# Patient Record
Sex: Female | Born: 1960 | Race: Black or African American | Hispanic: No | Marital: Married | Smoking: Former smoker
Health system: Southern US, Community
[De-identification: ages and names within clinical notes are randomized; demographics above are authoritative.]

## PROBLEM LIST (undated history)

## (undated) DIAGNOSIS — I10 Essential (primary) hypertension: Secondary | ICD-10-CM

## (undated) DIAGNOSIS — E785 Hyperlipidemia, unspecified: Secondary | ICD-10-CM

## (undated) DIAGNOSIS — M1990 Unspecified osteoarthritis, unspecified site: Secondary | ICD-10-CM

## (undated) DIAGNOSIS — R7302 Impaired glucose tolerance (oral): Secondary | ICD-10-CM

## (undated) DIAGNOSIS — Z860100 Personal history of colon polyps, unspecified: Secondary | ICD-10-CM

## (undated) DIAGNOSIS — E119 Type 2 diabetes mellitus without complications: Secondary | ICD-10-CM

## (undated) DIAGNOSIS — N921 Excessive and frequent menstruation with irregular cycle: Secondary | ICD-10-CM

## (undated) DIAGNOSIS — N6452 Nipple discharge: Secondary | ICD-10-CM

## (undated) DIAGNOSIS — L309 Dermatitis, unspecified: Secondary | ICD-10-CM

## (undated) DIAGNOSIS — J302 Other seasonal allergic rhinitis: Secondary | ICD-10-CM

## (undated) HISTORY — DX: Personal history of colon polyps, unspecified: Z86.0100

## (undated) HISTORY — DX: Essential (primary) hypertension: I10

## (undated) HISTORY — PX: HEMORRHOID SURGERY: SHX153

## (undated) HISTORY — DX: Impaired glucose tolerance (oral): R73.02

## (undated) HISTORY — DX: Personal history of colonic polyps: Z86.010

## (undated) HISTORY — DX: Hyperlipidemia, unspecified: E78.5

## (undated) HISTORY — DX: Excessive and frequent menstruation with irregular cycle: N92.1

## (undated) HISTORY — PX: COLONOSCOPY W/ POLYPECTOMY: SHX1380

## (undated) HISTORY — DX: Nipple discharge: N64.52

---

## 1998-08-30 ENCOUNTER — Emergency Department (HOSPITAL_COMMUNITY): Admission: EM | Admit: 1998-08-30 | Discharge: 1998-08-30 | Payer: Self-pay | Admitting: Emergency Medicine

## 2002-01-11 ENCOUNTER — Other Ambulatory Visit: Admission: RE | Admit: 2002-01-11 | Discharge: 2002-01-11 | Payer: Self-pay | Admitting: Family Medicine

## 2004-03-26 ENCOUNTER — Ambulatory Visit (HOSPITAL_COMMUNITY): Admission: RE | Admit: 2004-03-26 | Discharge: 2004-03-26 | Payer: Self-pay | Admitting: Family Medicine

## 2004-03-26 ENCOUNTER — Other Ambulatory Visit: Admission: RE | Admit: 2004-03-26 | Discharge: 2004-03-26 | Payer: Self-pay | Admitting: Family Medicine

## 2005-03-18 ENCOUNTER — Ambulatory Visit: Payer: Self-pay | Admitting: Internal Medicine

## 2005-03-19 ENCOUNTER — Ambulatory Visit: Payer: Self-pay | Admitting: Internal Medicine

## 2005-03-26 ENCOUNTER — Ambulatory Visit: Payer: Self-pay | Admitting: Internal Medicine

## 2005-04-03 ENCOUNTER — Ambulatory Visit (HOSPITAL_COMMUNITY): Admission: RE | Admit: 2005-04-03 | Discharge: 2005-04-03 | Payer: Self-pay | Admitting: Internal Medicine

## 2005-04-16 ENCOUNTER — Ambulatory Visit: Payer: Self-pay | Admitting: Internal Medicine

## 2005-05-21 ENCOUNTER — Ambulatory Visit: Payer: Self-pay | Admitting: Internal Medicine

## 2005-06-11 ENCOUNTER — Ambulatory Visit: Payer: Self-pay | Admitting: Internal Medicine

## 2005-08-10 ENCOUNTER — Ambulatory Visit: Payer: Self-pay | Admitting: Internal Medicine

## 2005-10-07 ENCOUNTER — Other Ambulatory Visit: Admission: RE | Admit: 2005-10-07 | Discharge: 2005-10-07 | Payer: Self-pay | Admitting: Obstetrics and Gynecology

## 2005-12-24 ENCOUNTER — Ambulatory Visit: Payer: Self-pay | Admitting: Internal Medicine

## 2006-01-21 ENCOUNTER — Ambulatory Visit: Payer: Self-pay | Admitting: Endocrinology

## 2006-03-12 ENCOUNTER — Ambulatory Visit: Payer: Self-pay | Admitting: Internal Medicine

## 2006-04-21 ENCOUNTER — Ambulatory Visit: Payer: Self-pay | Admitting: Internal Medicine

## 2006-08-12 ENCOUNTER — Ambulatory Visit: Payer: Self-pay | Admitting: Internal Medicine

## 2006-08-12 LAB — CONVERTED CEMR LAB
BUN: 16 mg/dL (ref 6–23)
Basophils Absolute: 0.1 10*3/uL (ref 0.0–0.1)
Basophils Relative: 0.9 % (ref 0.0–1.0)
Chloride: 96 meq/L (ref 96–112)
Chol/HDL Ratio, serum: 3.7
Cholesterol: 200 mg/dL (ref 0–200)
Creatinine, Ser: 1 mg/dL (ref 0.4–1.2)
Eosinophil percent: 9.5 % — ABNORMAL HIGH (ref 0.0–5.0)
GFR calc non Af Amer: 64 mL/min
Glomerular Filtration Rate, Af Am: 77 mL/min/{1.73_m2}
Glucose, Bld: 118 mg/dL — ABNORMAL HIGH (ref 70–99)
HDL: 54.4 mg/dL (ref >39.0)
Hemoglobin: 13 g/dL (ref 12.0–15.0)
MCHC: 33.2 g/dL (ref 30.0–36.0)
MCV: 87.9 fL (ref 78.0–100.0)
Monocytes Absolute: 0.5 10*3/uL (ref 0.2–0.7)
Neutro Abs: 3.9 10*3/uL (ref 1.4–7.7)
Neutrophils Relative %: 53.9 % (ref 43.0–77.0)
RBC: 4.43 M/uL (ref 3.87–5.11)
RDW: 12.5 % (ref 11.5–14.6)
Sodium: 140 meq/L (ref 135–145)
VLDL: 25 mg/dL (ref 0–40)
WBC: 7.3 10*3/uL (ref 4.5–10.5)

## 2006-11-29 ENCOUNTER — Ambulatory Visit: Payer: Self-pay | Admitting: Internal Medicine

## 2006-11-29 LAB — CONVERTED CEMR LAB
Bilirubin Urine: NEGATIVE
Crystals: NEGATIVE
Ketones, ur: NEGATIVE mg/dL
Leukocytes, UA: NEGATIVE
Nitrite: NEGATIVE
Specific Gravity, Urine: 1.02 (ref 1.000–1.03)
Total Protein, Urine: NEGATIVE mg/dL

## 2006-12-07 ENCOUNTER — Ambulatory Visit: Payer: Self-pay | Admitting: Internal Medicine

## 2006-12-24 ENCOUNTER — Encounter: Payer: Self-pay | Admitting: Internal Medicine

## 2006-12-24 ENCOUNTER — Ambulatory Visit: Payer: Self-pay | Admitting: Internal Medicine

## 2007-02-14 ENCOUNTER — Ambulatory Visit: Payer: Self-pay | Admitting: Internal Medicine

## 2007-03-01 ENCOUNTER — Encounter: Payer: Self-pay | Admitting: Internal Medicine

## 2007-03-01 LAB — CONVERTED CEMR LAB

## 2007-03-23 ENCOUNTER — Encounter: Payer: Self-pay | Admitting: Internal Medicine

## 2007-03-23 DIAGNOSIS — I10 Essential (primary) hypertension: Secondary | ICD-10-CM

## 2007-03-23 DIAGNOSIS — Z8679 Personal history of other diseases of the circulatory system: Secondary | ICD-10-CM | POA: Insufficient documentation

## 2007-03-23 DIAGNOSIS — J309 Allergic rhinitis, unspecified: Secondary | ICD-10-CM | POA: Insufficient documentation

## 2007-04-08 ENCOUNTER — Encounter (INDEPENDENT_AMBULATORY_CARE_PROVIDER_SITE_OTHER): Payer: Self-pay | Admitting: Surgery

## 2007-04-08 ENCOUNTER — Encounter (INDEPENDENT_AMBULATORY_CARE_PROVIDER_SITE_OTHER): Payer: Self-pay | Admitting: *Deleted

## 2007-04-08 ENCOUNTER — Ambulatory Visit (HOSPITAL_COMMUNITY): Admission: RE | Admit: 2007-04-08 | Discharge: 2007-04-08 | Payer: Self-pay | Admitting: Surgery

## 2007-04-08 HISTORY — PX: HEMORRHOID SURGERY: SHX153

## 2007-07-22 ENCOUNTER — Ambulatory Visit: Payer: Self-pay | Admitting: Internal Medicine

## 2007-07-22 DIAGNOSIS — M545 Low back pain: Secondary | ICD-10-CM | POA: Insufficient documentation

## 2007-08-05 ENCOUNTER — Ambulatory Visit: Payer: Self-pay | Admitting: Internal Medicine

## 2007-08-05 DIAGNOSIS — E119 Type 2 diabetes mellitus without complications: Secondary | ICD-10-CM

## 2007-08-18 ENCOUNTER — Telehealth: Payer: Self-pay | Admitting: Internal Medicine

## 2007-08-30 ENCOUNTER — Ambulatory Visit: Payer: Self-pay | Admitting: Internal Medicine

## 2007-08-30 DIAGNOSIS — E785 Hyperlipidemia, unspecified: Secondary | ICD-10-CM | POA: Insufficient documentation

## 2007-08-30 DIAGNOSIS — R635 Abnormal weight gain: Secondary | ICD-10-CM | POA: Insufficient documentation

## 2007-09-02 DIAGNOSIS — E876 Hypokalemia: Secondary | ICD-10-CM

## 2007-09-02 LAB — CONVERTED CEMR LAB
ALT: 33 units/L (ref 0–35)
AST: 27 units/L (ref 0–37)
Albumin: 3.7 g/dL (ref 3.5–5.2)
BUN: 8 mg/dL (ref 6–23)
Basophils Absolute: 0.1 10*3/uL (ref 0.0–0.1)
Basophils Relative: 0.5 % (ref 0.0–1.0)
Bilirubin, Direct: 0.2 mg/dL (ref 0.0–0.3)
Calcium: 9.1 mg/dL (ref 8.4–10.5)
Creatinine, Ser: 0.9 mg/dL (ref 0.4–1.2)
Eosinophils Relative: 2.9 % (ref 0.0–5.0)
HCT: 37.7 % (ref 36.0–46.0)
HDL: 57.2 mg/dL (ref >39.0)
Lymphocytes Relative: 8 % — ABNORMAL LOW (ref 12.0–46.0)
MCV: 87.5 fL (ref 78.0–100.0)
Neutro Abs: 8.6 10*3/uL — ABNORMAL HIGH (ref 1.4–7.7)
Neutrophils Relative %: 83.7 % — ABNORMAL HIGH (ref 43.0–77.0)
RBC: 4.31 M/uL (ref 3.87–5.11)
RDW: 12.5 % (ref 11.5–14.6)
TSH: 0.9 microintl units/mL (ref 0.35–5.50)
Total Protein: 7.7 g/dL (ref 6.0–8.3)
WBC: 10.3 10*3/uL (ref 4.5–10.5)

## 2007-11-24 ENCOUNTER — Ambulatory Visit: Payer: Self-pay | Admitting: Internal Medicine

## 2007-11-24 DIAGNOSIS — M549 Dorsalgia, unspecified: Secondary | ICD-10-CM | POA: Insufficient documentation

## 2007-11-24 LAB — HM COLONOSCOPY

## 2007-12-26 ENCOUNTER — Ambulatory Visit: Payer: Self-pay | Admitting: Internal Medicine

## 2007-12-26 LAB — CONVERTED CEMR LAB: Creatinine, Ser: 0.9 mg/dL (ref 0.4–1.2)

## 2007-12-28 ENCOUNTER — Telehealth: Payer: Self-pay | Admitting: Internal Medicine

## 2008-01-02 ENCOUNTER — Ambulatory Visit: Payer: Self-pay | Admitting: Internal Medicine

## 2008-01-02 DIAGNOSIS — M542 Cervicalgia: Secondary | ICD-10-CM | POA: Insufficient documentation

## 2008-01-02 DIAGNOSIS — N921 Excessive and frequent menstruation with irregular cycle: Secondary | ICD-10-CM

## 2008-01-02 DIAGNOSIS — R209 Unspecified disturbances of skin sensation: Secondary | ICD-10-CM | POA: Insufficient documentation

## 2008-01-30 ENCOUNTER — Telehealth: Payer: Self-pay | Admitting: Internal Medicine

## 2008-02-06 ENCOUNTER — Encounter: Payer: Self-pay | Admitting: Internal Medicine

## 2008-02-10 ENCOUNTER — Encounter: Payer: Self-pay | Admitting: Internal Medicine

## 2008-03-12 ENCOUNTER — Telehealth: Payer: Self-pay | Admitting: Internal Medicine

## 2008-04-25 ENCOUNTER — Ambulatory Visit: Payer: Self-pay | Admitting: Internal Medicine

## 2008-04-25 ENCOUNTER — Telehealth: Payer: Self-pay | Admitting: Internal Medicine

## 2008-04-25 LAB — CONVERTED CEMR LAB
ALT: 34 units/L (ref 0–35)
Alkaline Phosphatase: 53 units/L (ref 39–117)
BUN: 13 mg/dL (ref 6–23)
Basophils Relative: 0.2 % (ref 0.0–3.0)
Bilirubin, Direct: 0.1 mg/dL (ref 0.0–0.3)
CO2: 31 meq/L (ref 19–32)
Calcium: 9.3 mg/dL (ref 8.4–10.5)
Chloride: 104 meq/L (ref 96–112)
Cholesterol: 163 mg/dL (ref 0–200)
Eosinophils Absolute: 0.5 10*3/uL (ref 0.0–0.7)
HDL: 47.5 mg/dL (ref >39.0)
Hgb A1c MFr Bld: 6.6 % — ABNORMAL HIGH (ref 4.6–6.0)
Lymphocytes Relative: 32.7 % (ref 12.0–46.0)
MCHC: 33.9 g/dL (ref 30.0–36.0)
Monocytes Relative: 5.3 % (ref 3.0–12.0)
Neutro Abs: 3.4 10*3/uL (ref 1.4–7.7)
Potassium: 3.2 meq/L — ABNORMAL LOW (ref 3.5–5.1)
Sodium: 142 meq/L (ref 135–145)
Total Bilirubin: 0.6 mg/dL (ref 0.3–1.2)
Total CHOL/HDL Ratio: 3.4
Total Protein: 7.9 g/dL (ref 6.0–8.3)
Triglycerides: 136 mg/dL (ref 0–149)

## 2008-05-01 ENCOUNTER — Telehealth: Payer: Self-pay | Admitting: Internal Medicine

## 2008-05-10 ENCOUNTER — Telehealth (INDEPENDENT_AMBULATORY_CARE_PROVIDER_SITE_OTHER): Payer: Self-pay | Admitting: *Deleted

## 2008-05-10 ENCOUNTER — Ambulatory Visit: Payer: Self-pay | Admitting: Internal Medicine

## 2008-05-10 LAB — CONVERTED CEMR LAB: Potassium: 4.2 meq/L (ref 3.5–5.1)

## 2008-05-18 ENCOUNTER — Encounter: Payer: Self-pay | Admitting: Internal Medicine

## 2008-07-24 ENCOUNTER — Ambulatory Visit: Payer: Self-pay | Admitting: Internal Medicine

## 2008-07-24 DIAGNOSIS — B373 Candidiasis of vulva and vagina: Secondary | ICD-10-CM

## 2008-07-24 DIAGNOSIS — R110 Nausea: Secondary | ICD-10-CM

## 2008-08-28 ENCOUNTER — Telehealth: Payer: Self-pay | Admitting: Internal Medicine

## 2008-09-18 ENCOUNTER — Telehealth: Payer: Self-pay | Admitting: Internal Medicine

## 2008-09-18 ENCOUNTER — Ambulatory Visit: Payer: Self-pay | Admitting: Internal Medicine

## 2008-09-18 DIAGNOSIS — R1013 Epigastric pain: Secondary | ICD-10-CM

## 2008-09-18 LAB — CONVERTED CEMR LAB
ALT: 22 units/L (ref 0–35)
BUN: 10 mg/dL (ref 6–23)
Basophils Relative: 0.4 % (ref 0.0–3.0)
Bilirubin, Direct: 0.1 mg/dL (ref 0.0–0.3)
CO2: 34 meq/L — ABNORMAL HIGH (ref 19–32)
Chloride: 97 meq/L (ref 96–112)
Creatinine, Ser: 0.8 mg/dL (ref 0.4–1.2)
GFR calc non Af Amer: 82 mL/min
Hemoglobin: 12.8 g/dL (ref 12.0–15.0)
Lipase: 23 units/L (ref 11.0–59.0)
MCV: 88 fL (ref 78.0–100.0)
Monocytes Absolute: 0.8 10*3/uL (ref 0.1–1.0)
Neutrophils Relative %: 67.1 % (ref 43.0–77.0)
RBC: 4.27 M/uL (ref 3.87–5.11)
RDW: 12.1 % (ref 11.5–14.6)
Sodium: 137 meq/L (ref 135–145)
Total Bilirubin: 1 mg/dL (ref 0.3–1.2)
WBC: 11.7 10*3/uL — ABNORMAL HIGH (ref 4.5–10.5)

## 2008-09-19 ENCOUNTER — Ambulatory Visit (HOSPITAL_BASED_OUTPATIENT_CLINIC_OR_DEPARTMENT_OTHER): Admission: RE | Admit: 2008-09-19 | Discharge: 2008-09-19 | Payer: Self-pay | Admitting: Internal Medicine

## 2008-09-19 ENCOUNTER — Ambulatory Visit: Payer: Self-pay | Admitting: Diagnostic Radiology

## 2008-09-20 ENCOUNTER — Ambulatory Visit: Payer: Self-pay | Admitting: Internal Medicine

## 2008-09-21 ENCOUNTER — Telehealth: Payer: Self-pay | Admitting: Internal Medicine

## 2008-10-23 ENCOUNTER — Ambulatory Visit: Payer: Self-pay | Admitting: Internal Medicine

## 2008-10-23 DIAGNOSIS — M25519 Pain in unspecified shoulder: Secondary | ICD-10-CM

## 2008-10-25 ENCOUNTER — Encounter: Payer: Self-pay | Admitting: Internal Medicine

## 2008-11-16 ENCOUNTER — Ambulatory Visit: Payer: Self-pay | Admitting: Internal Medicine

## 2008-11-16 LAB — CONVERTED CEMR LAB
ALT: 20 units/L (ref 0–35)
AST: 18 units/L (ref 0–37)
CRP, High Sensitivity: 4 (ref 0.00–5.00)
H Pylori IgG: NEGATIVE
Microalb Creat Ratio: 2.5 mg/g (ref 0.0–30.0)
Microalb, Ur: 0.7 mg/dL (ref 0.0–1.9)
Total CHOL/HDL Ratio: 3

## 2008-11-20 ENCOUNTER — Ambulatory Visit: Payer: Self-pay | Admitting: Internal Medicine

## 2009-04-01 ENCOUNTER — Ambulatory Visit: Payer: Self-pay | Admitting: Internal Medicine

## 2009-04-08 ENCOUNTER — Ambulatory Visit: Payer: Self-pay | Admitting: Internal Medicine

## 2009-04-25 LAB — CONVERTED CEMR LAB
CRP: 0.3 mg/dL (ref <0.6)
Chloride: 102 meq/L (ref 96–112)
Creatinine, Ser: 0.95 mg/dL (ref 0.40–1.20)
Potassium: 3.9 meq/L (ref 3.5–5.3)
Sodium: 139 meq/L (ref 135–145)

## 2009-10-24 ENCOUNTER — Ambulatory Visit: Payer: Self-pay | Admitting: Internal Medicine

## 2009-10-24 LAB — CONVERTED CEMR LAB
CO2: 26 meq/L (ref 19–32)
Calcium: 10.4 mg/dL (ref 8.4–10.5)
Chloride: 102 meq/L (ref 96–112)
Cholesterol: 181 mg/dL (ref 0–200)
Glucose, Bld: 146 mg/dL — ABNORMAL HIGH (ref 70–99)
HDL: 68 mg/dL (ref >39)
LDL Cholesterol: 93 mg/dL (ref 0–99)
Total CHOL/HDL Ratio: 2.7
Triglycerides: 101 mg/dL (ref <150)

## 2009-11-01 ENCOUNTER — Ambulatory Visit: Payer: Self-pay | Admitting: Internal Medicine

## 2009-11-01 DIAGNOSIS — L659 Nonscarring hair loss, unspecified: Secondary | ICD-10-CM | POA: Insufficient documentation

## 2009-11-19 ENCOUNTER — Telehealth: Payer: Self-pay | Admitting: Internal Medicine

## 2009-12-05 ENCOUNTER — Telehealth: Payer: Self-pay | Admitting: Internal Medicine

## 2009-12-12 ENCOUNTER — Ambulatory Visit: Payer: Self-pay | Admitting: Internal Medicine

## 2009-12-12 LAB — CONVERTED CEMR LAB
AST: 21 units/L (ref 0–37)
Albumin: 4.8 g/dL (ref 3.5–5.2)
BUN: 11 mg/dL (ref 6–23)
Basophils Absolute: 0 10*3/uL (ref 0.0–0.1)
Basophils Relative: 0 % (ref 0–1)
Bilirubin, Direct: 0.1 mg/dL (ref 0.0–0.3)
CO2: 27 meq/L (ref 19–32)
Calcium: 10.2 mg/dL (ref 8.4–10.5)
Glucose, Bld: 88 mg/dL (ref 70–99)
HCT: 40 % (ref 36.0–46.0)
Hemoglobin: 13.3 g/dL (ref 12.0–15.0)
Lymphocytes Relative: 34 % (ref 12–46)
MCHC: 33.3 g/dL (ref 30.0–36.0)
Neutrophils Relative %: 54 % (ref 43–77)
Platelets: 264 10*3/uL (ref 150–400)
RDW: 12.5 % (ref 11.5–15.5)
Sodium: 136 meq/L (ref 135–145)

## 2009-12-13 ENCOUNTER — Telehealth: Payer: Self-pay | Admitting: Internal Medicine

## 2009-12-23 ENCOUNTER — Encounter: Payer: Self-pay | Admitting: Internal Medicine

## 2009-12-23 LAB — CONVERTED CEMR LAB: Lipase: 81 units/L — ABNORMAL HIGH (ref 0–75)

## 2009-12-25 ENCOUNTER — Ambulatory Visit (HOSPITAL_BASED_OUTPATIENT_CLINIC_OR_DEPARTMENT_OTHER): Admission: RE | Admit: 2009-12-25 | Discharge: 2009-12-25 | Payer: Self-pay | Admitting: Internal Medicine

## 2009-12-25 ENCOUNTER — Ambulatory Visit: Payer: Self-pay | Admitting: Internal Medicine

## 2009-12-25 ENCOUNTER — Ambulatory Visit: Payer: Self-pay | Admitting: Diagnostic Radiology

## 2009-12-25 DIAGNOSIS — K859 Acute pancreatitis without necrosis or infection, unspecified: Secondary | ICD-10-CM

## 2009-12-26 ENCOUNTER — Telehealth: Payer: Self-pay | Admitting: Internal Medicine

## 2009-12-26 DIAGNOSIS — R748 Abnormal levels of other serum enzymes: Secondary | ICD-10-CM | POA: Insufficient documentation

## 2009-12-27 ENCOUNTER — Encounter (INDEPENDENT_AMBULATORY_CARE_PROVIDER_SITE_OTHER): Payer: Self-pay | Admitting: *Deleted

## 2010-01-06 ENCOUNTER — Telehealth: Payer: Self-pay | Admitting: Internal Medicine

## 2010-01-13 DIAGNOSIS — K219 Gastro-esophageal reflux disease without esophagitis: Secondary | ICD-10-CM

## 2010-01-13 DIAGNOSIS — Z860100 Personal history of colon polyps, unspecified: Secondary | ICD-10-CM | POA: Insufficient documentation

## 2010-01-13 DIAGNOSIS — K649 Unspecified hemorrhoids: Secondary | ICD-10-CM | POA: Insufficient documentation

## 2010-01-16 ENCOUNTER — Ambulatory Visit: Payer: Self-pay | Admitting: Internal Medicine

## 2010-01-16 LAB — CONVERTED CEMR LAB: Amylase: 108 units/L (ref 27–131)

## 2010-01-17 ENCOUNTER — Ambulatory Visit: Payer: Self-pay | Admitting: Internal Medicine

## 2010-01-20 ENCOUNTER — Encounter: Payer: Self-pay | Admitting: Internal Medicine

## 2010-01-22 ENCOUNTER — Telehealth: Payer: Self-pay | Admitting: Internal Medicine

## 2010-01-24 ENCOUNTER — Telehealth: Payer: Self-pay | Admitting: Internal Medicine

## 2010-01-29 ENCOUNTER — Encounter (INDEPENDENT_AMBULATORY_CARE_PROVIDER_SITE_OTHER): Payer: Self-pay | Admitting: *Deleted

## 2010-07-09 ENCOUNTER — Telehealth: Payer: Self-pay | Admitting: Internal Medicine

## 2010-07-11 LAB — CONVERTED CEMR LAB
CO2: 29 meq/L (ref 19–32)
Calcium: 9.9 mg/dL (ref 8.4–10.5)
Chloride: 101 meq/L (ref 96–112)
Creatinine, Urine: 243.1 mg/dL
Glucose, Bld: 87 mg/dL (ref 70–99)
Microalb Creat Ratio: 3.7 mg/g (ref 0.0–30.0)

## 2010-07-15 ENCOUNTER — Ambulatory Visit: Payer: Self-pay | Admitting: Internal Medicine

## 2010-09-21 ENCOUNTER — Encounter: Payer: Self-pay | Admitting: Family Medicine

## 2010-10-02 NOTE — Miscellaneous (Signed)
Summary: Prilosec Rx  Clinical Lists Changes  Medications: Added new medication of PRILOSEC 20 MG CPDR (OMEPRAZOLE) take 1 tablet by mouth once daily 30 minutes before breakfast - Signed Rx of PRILOSEC 20 MG CPDR (OMEPRAZOLE) take 1 tablet by mouth once daily 30 minutes before breakfast;  #30 x 6;  Signed;  Entered by: Jennye Boroughs RN;  Authorized by: Hart Carwin MD;  Method used: [facility] [address] [phone]*, [address] [place], [place], [place]  [address], Ph: [phone] or [phone], Fax: [phone]    Prescriptions: PRILOSEC 20 MG CPDR (OMEPRAZOLE) take 1 tablet by mouth once daily 30 minutes before breakfast  #30 x 6   Entered by:   Jennye Boroughs RN   Authorized by:   Hart Carwin MD   Signed by:   Jennye Boroughs RN on 01/17/2010   Method used:   [facility]        [facility]  [address] [phone][facility] (retail)       [address]       [place], [place]  [address]       Ph: [phone] or [phone]       Fax: [phone]   RxID:   [id number]

## 2010-10-02 NOTE — Letter (Signed)
Summary: Results Letter  [facility]  [address] [place], [place] [address]   Phone: [phone]  Fax: [phone]        January 29, 2010 MRN: [id number]    Linda Christian [address] [place], [place]  [address]        Dear [name],      I have attempted to reach you by phone several times but have been unsuccessful, so I am sending this letter. Your recent Endoscopy biopsy showed you have a germ in your stomache called Helicobacter Pylori. Dr.Brodie wants to treat this with a 10 day course of antibiotics called PYLERA. I have sent the prescription to [facility] pharmacy[facility] [address] I have enclosed a phamplet with information on H-Pylori and I have enclosed directions on how to take the [facility]. Please call [phone] for any further questions. Thank You.       Sincerely,  Laureen Ochs LPN  This letter has been electronically signed by your physician.  Appended Document: Results Letter Letter mailed to patient.  Appended Document: Results Letter I spoke w/pt., she picked-up her script yesterday. Pt. instructed to call back as needed.

## 2010-10-02 NOTE — Op Note (Signed)
Summary: External Hemorrhoidectomy  NAME:  Linda Christian, Linda Christian                ACCOUNT NO.:  [id number]      MEDICAL RECORD NO.:  [id number]          PATIENT TYPE:  AMB      LOCATION:  DAY                          FACILITY:  [facility]      PHYSICIAN:  Wilmon Arms. Corliss Skains, M.D. DATE OF BIRTH:  10/09/1960      DATE OF PROCEDURE:  04/08/2007   DATE OF DISCHARGE:                                  OPERATIVE REPORT      PREOPERATIVE DIAGNOSIS:  External hemorrhoids.      POSTOPERATIVE DIAGNOSIS:  External hemorrhoids.      PROCEDURE PERFORMED:  External hemorrhoidectomy.      SURGEON:  Wilmon Arms. Corliss Skains, M.D., FACS      ANESTHESIA:  General.      INDICATIONS:  The patient is a 50 year old female with a long history of   external hemorrhoids.  She has also had some painless bleeding with   bowel movements which resolved.  Colonoscopy this year was unremarkable.   The patient presents for hemorrhoidectomy.      DESCRIPTION OF PROCEDURE:  The patient was brought to the operating room   and placed in the supine position on the operating room table.  After an   adequate level of general anesthesia was obtained, the patient's legs   were placed in yellow fin stirrups in lithotomy position.  Her perineum   was prepped with Betadine and draped in a sterile fashion.  The perianal   region was infiltrated with 0.25% percent Marcaine.  Her anus was   dilated up to three fingers.  The silver bullet retractor was inserted.   The patient had a posterior external hemorrhoid which tracked into a   small internal hemorrhoid.  There was no other internal hemorrhoid   disease.  There was no perineal abscess or fistula.  The posterior   hemorrhoid was grasped with a Pennington clamp.  The mucosa and skin was   scored with a knife.  Cautery was then used to dissect the hemorrhoidal   tissue off of the sphincter muscle.  Hemostasis was obtained with   cautery.  The mucosa and skin were then reapproximated with a running  3-   0 Vicryl suture.  The most external half a centimeter was left open to   allow drainage.  A small piece of Gelfoam was tucked underneath the   closure line.      The retractor was then turned around and the anterior hemorrhoid was   grasped with a Pennington clamp.  This was excised in a similar fashion   and closed with a 3-0 Vicryl. Gelfoam with lidocaine jelly was then   inserted into the anal canal.  A dry dressing was applied.  The patient   was then extubated and brought to the recovery room in stable condition.   All sponge, instrument, and needle counts were correct.               Wilmon Arms. Tsuei, M.D.   Electronically Signed  MKT/MEDQ  D:  04/08/2007  T:  04/08/2007  Job:  [id number]  NAME:  Linda Christian, Linda Christian                ACCOUNT NO.:  [id number]      MEDICAL RECORD NO.:  [id number]          PATIENT TYPE:  AMB      LOCATION:  DAY                          [facility]ACILITY:  [facility]      PHYSICIAN:  Wilmon Arms. Corliss Skains, M.D. DATE OF BIRTH:  1961/03/01      DATE OF PROCEDURE:  04/08/2007   DATE OF DISCHARGE:                                  OPERATIVE REPORT      PREOPERATIVE DIAGNOSIS:  External hemorrhoids.      POSTOPERATIVE DIAGNOSIS:  External hemorrhoids.      PROCEDURE PERFORMED:  External hemorrhoidectomy.      SURGEON:  Wilmon Arms. Corliss Skains, M.D., FACS      ANESTHESIA:  General.      INDICATIONS:  The patient is a 50 year old female with a long history of   external hemorrhoids.  She has also had some painless bleeding with   bowel movements which resolved.  Colonoscopy this year was unremarkable.   The patient presents for hemorrhoidectomy.      DESCRIPTION OF PROCEDURE:  The patient was brought to the operating room   and placed in the supine position on the operating room table.  After an   adequate level of general anesthesia was obtained, the patient's legs   were placed in yellow fin stirrups in lithotomy position.  Her perineum   was prepped with  Betadine and draped in a sterile fashion.  The perianal   region was infiltrated with 0.25% percent Marcaine.  Her anus was   dilated up to three fingers.  The silver bullet retractor was inserted.   The patient had a posterior external hemorrhoid which tracked into a   small internal hemorrhoid.  There was no other internal hemorrhoid   disease.  There was no perineal abscess or fistula.  The posterior   hemorrhoid was grasped with a Pennington clamp.  The mucosa and skin was   scored with a knife.  Cautery was then used to dissect the hemorrhoidal   tissue off of the sphincter muscle.  Hemostasis was obtained with   cautery.  The mucosa and skin were then reapproximated with a running 3-   0 Vicryl suture.  The most external half a centimeter was left open to   allow drainage.  A small piece of Gelfoam was tucked underneath the   closure line.      The retractor was then turned around and the anterior hemorrhoid was   grasped with a Pennington clamp.  This was excised in a similar fashion   and closed with a 3-0 Vicryl. Gelfoam with lidocaine jelly was then   inserted into the anal canal.  A dry dressing was applied.  The patient   was then extubated and brought to the recovery room in stable condition.   All sponge, instrument, and needle counts were correct.               Wilmon Arms. Tsuei, M.D.  Electronically Signed            MKT/MEDQ  D:  04/08/2007  T:  04/08/2007  Job:  [id number]

## 2010-10-02 NOTE — Assessment & Plan Note (Signed)
Summary: follow up  /ov/hea   Vital Signs:  Patient profile:   50 year old female Height:      65.5 inches Weight:      184 pounds BMI:     30.26 O2 Sat:      98 % on Room air Temp:     98.7 degrees F oral Pulse rate:   95 / minute Resp:     18 per minute BP sitting:   100 / 62  (right arm) Cuff size:   large  Vitals Entered By: Glendell Docker CMA (July 15, 2010 9:07 AM)  O2 Flow:  Room air CC: follow-up visit, Type 2 diabetes mellitus follow-up Is Patient Diabetic? Yes Did you bring your meter with you today? No Pain Assessment Patient in pain? no      Comments disucss re-starting Victoza   Primary Care Provider:  Dondra Spry DO  CC:  follow-up visit and Type 2 diabetes mellitus follow-up.  History of Present Illness:  Type 2 Diabetes Mellitus Follow-Up      This is a 50 year old woman who presents for Type 2 diabetes mellitus follow-up.  The patient reports weight gain, but denies lightheadedness.  The patient denies the following symptoms: chest pain.  Since the last visit the patient reports good dietary compliance and not exercising regularly.  victoza stopped due to possible pancreatitis  htn - stable  Preventive Screening-Counseling & Management  Alcohol-Tobacco     Smoking Status: quit  Allergies (verified): No Known Drug Allergies  Past History:  Past Medical History: Allergic rhinitis Hypertension Colonic polyps, hx of - hyperplastic 4/08 glucose intolerance - diabetes type II   Hyperlipidemia   Menometorrhagia        Past Surgical History: Colon polypectomy   Hemorrhoidectomy        Family History: Family History Hypertension: mother, father mother & father with DM II         No FH of Colon Cancer:     Social History: Married 2 children - 1 biol, 1 adopted Former Smoker    Alcohol use-no   Occupation:  Advertising account planner        Daily Caffeine Use rare coffee--- no sodas   Physical Exam  General:  alert, well-developed, and  well-nourished.   Head:  focal alopecia and female-pattern balding.   Lungs:  normal respiratory effort and normal breath sounds.   Heart:  normal rate, regular rhythm, and no gallop.   Pulses:  Rdorsalis pedis and posterior tibial pulses are full and equal bilaterally  Diabetes Management Exam:    Foot Exam (with socks and/or shoes not present):       Inspection:          Right foot: normal   Impression & Recommendations:  Problem # 1:  PANCREATITIS (ICD-577.0) Assessment Improved CT of abd negative lipase returned to normal likely medication effect victoza discontinued  Problem # 2:  DIABETES MELLITUS, TYPE II, BORDERLINE (ICD-790.29)  Her updated medication list for this problem includes:    Onglyza 5 Mg Tabs (Saxagliptin hcl) ..... One by mouth once daily  Labs Reviewed: Creat: 0.90 (07/11/2010)     Problem # 3:  ALOPECIA (ICD-704.00)  Complete Medication List: 1)  Hydrochlorothiazide 25 Mg Tabs (Hydrochlorothiazide) .Marland Kitchen.. 1 once daily 2)  Klor-con M20 20 Meq Tbcr (Potassium chloride crys cr) .... Two tablets by mouth once daily 3)  Omeprazole 20 Mg Cpdr (Omeprazole) .... One by mouth once daily 4)  Onglyza 5 Mg  Tabs (Saxagliptin hcl) .... One by mouth once daily 5)  Clotrimazole-betamethasone 1-0.05 % Crea (Clotrimazole-betamethasone) .... Use as directed  Patient Instructions: 1)  Please schedule a follow-up appointment in 4 months. 2)  BMP prior to visit, ICD-9:  401.9 3)  HbgA1C prior to visit, ICD-9: 250.00 4)  TSH:  704.00 5)  Free T4: 704.00 6)  Please return for lab work one (1) week before your next appointment.  Prescriptions: CLOTRIMAZOLE-BETAMETHASONE 1-0.05 % CREA (CLOTRIMAZOLE-BETAMETHASONE) use as directed  #30 grams x 1   Entered and Authorized by:   D. Thomos Lemons DO   Signed by:   D. Thomos Lemons DO on 07/15/2010   Method used:   [facility]  [address] [phone][facility] (retail)       [address]       [place], [place]  [address]       Ph: [phone] or [phone]       Fax: [phone]   RxID:   [id number] ONGLYZA 5 MG TABS (SAXAGLIPTIN HCL) one by mouth once daily  #30 x 3   Entered and Authorized by:   D. Thomos Lemons DO   Signed by:   D. Thomos Lemons DO on 07/15/2010   Method used:   [facility]        [facility]  [address] [phone]* (retail)       [address]       [place], [place]  [address]       Ph: [phone] or [phone]       Fax: [phone]   RxID:   [id number]    Orders Added: 1)  Est. Patient Level III [[address]]   Immunization History:  Influenza Immunization History:    Influenza:  historical (05/13/2010)   Immunization History:  Influenza Immunization History:    Influenza:  Historical (05/13/2010)     Current Allergies (reviewed today): No known allergies

## 2010-10-02 NOTE — Letter (Signed)
Summary: Primary Care Consult Scheduled Letter  [place] [facility]  [address]   [place], [place] [address]   Phone: [phone]  Fax: [phone]      12/27/2009 MRN: [id number]  [facility] [address] [place], [place]  [address]    Dear Ms. Polcyn,      We have scheduled an appointment for you.  At the recommendation of Dr.YOO, we have scheduled you a consult with DR Georgina Quint GASTROENTEROLOY  on _MAY 19,2011 at 9:15AM.  Their address is_[address],  [place] . The office phone number is [phone].  If this appointment day and time is not convenient for you, please feel free to call the office of the doctor you are being referred to at the number listed above and reschedule the appointment.     It is important for you to keep your scheduled appointments. We are here to make sure you are given good patient care. If you have questions or you have made changes to your appointment, please notify us at  [phone], ask for  HELEN.    Thank you,  Darral Dash  Patient Care Coordinator [place] [facility]

## 2010-10-02 NOTE — Progress Notes (Signed)
Summary: Blood Work   Phone Note Call from Patient Call back at [phone]   Caller: Patient Call For: D. Thomos Lemons DO Summary of Call: patient called and stated she is scheduled for follow up on the 15th of November and she would like to know if there is blood work needed prior to her appt. The last labs drawn were in April, and it does not look like she has had labs since then. Initial call taken by: Glendell Docker CMA,  July 09, 2010 9:35 AM  Follow-up for Phone Call        BMP prior to visit, ICD-9:  401.9 HbgA1C prior to visit, ICD-9: 250.00 Urine Microalbumin prior to visit, ICD-9: 250.00  Follow-up by: D. Thomos Lemons DO,  July 09, 2010 11:27 AM  Additional Follow-up for Phone Call Additional follow up Details #1::        call returned to patient at [phone], she has been advised of fasting blood work. She states she will have labs drawn at [facility] location in the morning. Labs have been entered for blood draw Additional Follow-up by: Glendell Docker CMA,  July 09, 2010 12:09 PM

## 2010-10-02 NOTE — Progress Notes (Signed)
Summary: CT results  Phone Note Call from Patient   Caller: Patient Summary of Call: Pt [facility] stating she is very anxious about CT and would like to know if you have the results back. Please advise. Initial call taken by: Payton Spark CMA,  December 26, 2009 12:02 PM  Follow-up for Phone Call        left message for pt to call back to discuss CT results Follow-up by: D. Thomos Lemons DO,  December 26, 2009 12:21 PM  Additional Follow-up for Phone Call Additional follow up Details #1::        discussed results with pt. we discussed nonpancreatic causes for elevation in lipase. medication effect, possible PUD I advised GI consultation continue PPI Additional Follow-up by: D. Thomos Lemons DO,  December 26, 2009 1:22 PM  New Problems: OTHER NONSPECIFIC ABNORMAL SERUM ENZYME LEVELS (ICD-790.5)   Additional Follow-up for Phone Call Additional follow up Details #2::    Appt  with [facility]    Dr Juanda Chance     May 19 Follow-up by: Darral Dash,  Dec 30, 2009 10:16 AM  New Problems: OTHER NONSPECIFIC ABNORMAL SERUM ENZYME LEVELS (ICD-790.5)

## 2010-10-02 NOTE — Letter (Signed)
Summary: Patient [facility] Biopsy Results   Gastroenterology  [address] [place], [place] [address]   Phone: [phone]  Fax: [phone]        Jan 20, 2010 MRN: [id number]    LADONYA JERKINS [address] [place], [place]  [address]    Dear Ms. Kipp,  I am pleased to inform you that the biopsies taken during your recent endoscopic examination did not show any evidence of cancer upon pathologic examination.The biopsies from Your stomach showed H.Pylori infection. Please call Debra [phone] to talk to You  about the specific treatment of H.Pylori  Additional information/recommendations:  __No further action is needed at this time.  Please follow-up with      your primary care physician for your other healthcare needs.  _x_ Please call [phone] to schedule a return visit to review      your condition.  __ Continue with the treatment plan as outlined on the day of your      exam.     Please call us if you are having persistent problems or have questions about your condition that have not been fully answered at this time.  Sincerely,  Hart Carwin MD  This letter has been electronically signed by your physician.  Appended Document: Patient Notice-Endo Biopsy Results letter mailed

## 2010-10-02 NOTE — Progress Notes (Signed)
Summary: Mail Order Refill  Phone Note Refill Request Message from:  Fax from Pharmacy on November 19, 2009 9:08 AM  Refills Requested: Medication #1:  KLOR-CON M20 20 MEQ  TBCR two tablets by mouth once daily   Brand Name Necessary? No   Supply Requested: 3 months  Medication #2:  HYDROCHLOROTHIAZIDE 25 MG  TABS 1 once daily   Supply Requested: 3 months  Method Requested: Electronic Initial call taken by: Glendell Docker CMA,  November 19, 2009 9:09 AM  Follow-up for Phone Call        Rx completed in Dr. Tiajuana Amass Follow-up by: Glendell Docker CMA,  November 19, 2009 9:10 AM    Prescriptions: KLOR-CON M20 20 MEQ  TBCR (POTASSIUM CHLORIDE CRYS CR) two tablets by mouth once daily  #180 x 3   Entered by:   Glendell Docker CMA   Authorized by:   D. Thomos Lemons DO   Signed by:   Glendell Docker CMA on 11/19/2009   Method used:   Electronically to        [facility]* (mail-order)             ,          Ph: [phone]       Fax: [phone]   RxID:   [id number] HYDROCHLOROTHIAZIDE 25 MG  TABS (HYDROCHLOROTHIAZIDE) 1 once daily  #90 x 3   Entered by:   Glendell Docker CMA   Authorized by:   D. Thomos Lemons DO   Signed by:   Glendell Docker CMA on 11/19/2009   Method used:   Electronically to        [facility]* (mail-order)             ,          Ph: [phone]       Fax: [phone]   RxID:   [id number]

## 2010-10-02 NOTE — Assessment & Plan Note (Signed)
Summary: FOLLOW UP/DK   Vital Signs:  Patient profile:   50 year old female Height:      65.5 inches Weight:      182 pounds BMI:     29.93 O2 Sat:      99 % on Room air Temp:     97.9 degrees F oral Pulse rate:   84 / minute Pulse rhythm:   regular Resp:     18 per minute BP sitting:   120 / 74  (left arm) Cuff size:   large  Vitals Entered By: Glendell Docker CMA (December 25, 2009 10:25 AM)  O2 Flow:  Room air CC: Rm 3- Follow up disease Management   Primary Care Provider:  Dondra Spry DO  CC:  Rm 3- Follow up disease Management.  History of Present Illness: 50 y/o AA female for follow up re:  epigastric pain and elevated lipase. abd pain better since starting PPI but not completely resolved She stopped victoza as directed no vomiting no alcohol use  Allergies (verified): No Known Drug Allergies  Past History:  Past Medical History: Allergic rhinitis Hypertension Colonic polyps, hx of - hyperplastic 4/08 glucose intolerance - diabetes type II  Hyperlipidemia   Menometorrhagia        Past Surgical History: Colon polypectomy   Hemorrhoidectomy           Family History: Family History Hypertension mother with DM II          Social History: Married 2 children - 1 biol, 1 adopted Former Smoker   Alcohol use-no   Occupation:  Advertising account planner         Physical Exam  General:  alert, well-developed, and well-nourished.   Lungs:  normal respiratory effort and normal breath sounds.   Heart:  normal rate, regular rhythm, and no gallop.   Abdomen:  soft.  mild epigastric tenderness   Impression & Recommendations:  Problem # 1:  PANCREATITIS (ICD-577.0)  pt prev seen for epigastric pain.  blood work showed mild elevation in lipase. abd got better with PPI.  lipase trending lower 110 to 81.  she reports pain actually gets better with food intake obtain CT of abd and pelvis unexplained etiology of pancreatitis.  question side effect of victoza - she stopped  taking as directed consider GI referral.   lipase elevation may also occur in setting of duodenal ulcer  Orders: CT with Contrast (CT w/ contrast)  Complete Medication List: 1)  Hydrochlorothiazide 25 Mg Tabs (Hydrochlorothiazide) .Marland Kitchen.. 1 once daily 2)  Klor-con M20 20 Meq Tbcr (Potassium chloride crys cr) .... Two tablets by mouth once daily 3)  Omeprazole 20 Mg Cpdr (Omeprazole) .... One by mouth once daily  Patient Instructions: 1)  Increase fluid intake 2)  small meals - avoid heavy protein or fatty meals 3)  We will contact you re:  CT of abd results  Current Allergies (reviewed today): No known allergies

## 2010-10-02 NOTE — Progress Notes (Signed)
Summary: refills-- Hctz, Klor-con  Phone Note [facility] Request [facility] [phone] on Jan 24, 2010 9:36 AM  Refills Requested: Medication #1:  HYDROCHLOROTHIAZIDE 25 MG  TABS 1 once daily   Supply Requested: 1 month   Last Refilled: 11/17/2009  Medication #2:  KLOR-CON M20 20 MEQ  TBCR two tablets by mouth once daily   Supply Requested: 1 month   Last Refilled: 11/01/2009 Refills were sent to [facility] on 11/21/09 #90 X 3 refills. Does pt still need these filled locally? Left message on machine to return my call.   Mervin Kung CMA  Jan 24, 2010 9:34 AM   [facility]  [address] [phone][facility]  Follow-up for Phone Call        Left message on machine to return my call.  Mervin Kung CMA  Jan 28, 2010 8:17 AM   Additional Follow-up for Phone Call Additional follow up Details #1::        [facility] was [facility] recieves rxs from mail order. Call placed to patient at [phone], no answer, detailed voice message left informing patient of rxs recived from local pharmacy. She was advised rxs were sent to mail order, and if she still needed to have them sent to call back requesting them to local pharmacy.  Additional Follow-up by: Glendell Docker CMA,  Jan 28, 2010 9:16 AM

## 2010-10-02 NOTE — Letter (Signed)
Summary: EGD Instructions  [facility]  [address] [place], [place] [address]   Phone: [phone]  Fax: [phone]       Linda Christian    Aug 19, 1961    MRN: [id number]       Procedure Day /Date: 01/17/10 Friday     Arrival Time: 8:00 am     Procedure Time: 9:00 am     Location of Procedure:                    _x  _ [facility] (4th Floor)  PREPARATION FOR ENDOSCOPY   On 01/17/10 THE DAY OF THE PROCEDURE:  1.   No solid foods, milk or milk products are allowed after midnight the night before your procedure.  2.   Do not drink anything colored red or purple.  Avoid juices with pulp.  No orange juice.  3.  You may drink clear liquids until 7:00 am, which is 2 hours before your procedure.                                                                                                CLEAR LIQUIDS INCLUDE: Water Jello Ice Popsicles Tea (sugar ok, no milk/cream) Powdered fruit flavored drinks Coffee (sugar ok, no milk/cream) Gatorade Juice: apple, white grape, white cranberry  Lemonade Clear bullion, consomm, broth Carbonated beverages (any kind) Strained chicken noodle soup Hard Candy   MEDICATION INSTRUCTIONS  Unless otherwise instructed, you should take regular prescription medications with a small sip of water as early as possible the morning of your procedure.                  OTHER INSTRUCTIONS  You will need a responsible adult at least 50 years of age to accompany you and drive you home.   This person must remain in the waiting room during your procedure.  Wear loose fitting clothing that is easily removed.  Leave jewelry and other valuables at home.  However, you may wish to bring a book to read or an iPod/MP3 player to listen to music as you wait for your procedure to start.  Remove all body piercing jewelry and leave at home.  Total time from sign-in until discharge is approximately 2-3 hours.  You should go  home directly after your procedure and rest.  You can resume normal activities the day after your procedure.  The day of your procedure you should not:   Drive   Make legal decisions   Operate machinery   Drink alcohol   Return to work  You will receive specific instructions about eating, activities and medications before you leave.    The above instructions have been reviewed and explained to me by  Lamona Curl CMA [name]  Jan 16, 2010 10:07 AM     I fully understand and can verbalize these instructions _____________________________ Date 01/16/10

## 2010-10-02 NOTE — Progress Notes (Signed)
Summary: Lab Results  Phone Note Outgoing Call   Summary of Call: call pt - pancreatic enzyme is slightly elevated.   stop / hold victoza.  increase fluids.   needs OV next week (lipase should be repeated) if abd pain gets worse,   she needs to go to ER.     Initial call taken by: D. Thomos Lemons DO,  December 13, 2009 9:22 AM  Follow-up for Phone Call        patient advised per Dr Artist Pais instructions, she states she will be out of town next  week and prefers to schedule with Dr Artist Pais the following week. Follow-up by: Glendell Docker CMA,  December 13, 2009 10:40 AM

## 2010-10-02 NOTE — Assessment & Plan Note (Signed)
Summary: 6 MONTH ROV-CH, rsched- jr rsc from bump with patient/mhf   Vital Signs:  Patient profile:   50 year old female Weight:      185 pounds BMI:     30.43 O2 Sat:      99 % on Room air Temp:     98.0 degrees F oral Pulse rate:   76 / minute Pulse rhythm:   regular Resp:     18 per minute BP sitting:   130 / 80  (right arm) Cuff size:   large  Vitals Entered By: Glendell Docker CMA (November 01, 2009 2:23 PM)  O2 Flow:  Room air CC: Rm 2- 6 Month Follow up  Comments follow up no concerns   Primary Care Provider:  DThomos Lemons DO  CC:  Rm 2- 6 Month Follow up .  History of Present Illness:  Hypertension Follow-Up      This is a 50 year old woman who presents for Hypertension follow-up.  The patient denies lightheadedness and headaches.  The patient denies the following associated symptoms: chest pain.  Compliance with medications (by patient report) has been near 100%.    DM II borderline - mild wt gain.  difficulty controlling appetite.   A1c is worse.  Allergies (verified): No Known Drug Allergies  Past History:  Past Medical History: Allergic rhinitis Hypertension Colonic polyps, hx of - hyperplastic 4/08 glucose intolerance - diabetes type II Hyperlipidemia  Menometorrhagia       Past Surgical History: Colon polypectomy   Hemorrhoidectomy        Family History: Family History Hypertension mother with DM II        Social History: Married 2 children - 1 biol, 1 adopted Former Smoker   Alcohol use-no  Occupation:  Advertising account planner       Physical Exam  General:  alert, well-developed, and well-nourished.   Lungs:  normal respiratory effort and normal breath sounds.   Heart:  normal rate, regular rhythm, and no gallop.   Extremities:  trace left pedal edema and trace right pedal edema.     Impression & Recommendations:  Problem # 1:  HYPERTENSION (ICD-401.9) stable.  Maintain current medication regimen.  Her updated medication list for this problem  includes:    Hydrochlorothiazide 25 Mg Tabs (Hydrochlorothiazide) .Marland Kitchen... 1 once daily  BP today: 130/80 Prior BP: 110/80 (04/08/2009)  Labs Reviewed: K+: 4.1 (10/24/2009) Creat: : 0.90 (10/24/2009)   Chol: 181 (10/24/2009)   HDL: 68 (10/24/2009)   LDL: 93 (10/24/2009)   TG: 101 (10/24/2009)  Problem # 2:  ALOPECIA (ICD-704.00) she is having progressive alopecia.  she is wearing hair weave but describes female pattern balding.  she is waiting to see dermatologist at [facility].  Her appt is in   Problem # 3:  DIABETES MELLITUS, TYPE II, BORDERLINE (ICD-790.29) A1c worse at 6.6.   Pt has difficulty with appetite control  add victoza.  Her updated medication list for this problem includes:    Metformin Hcl 500 Mg Tabs (Metformin hcl) ..... One by mouth bid    Victoza 18 Mg/69ml Soln (Liraglutide) ..... Inject 1.2 mg once daily  Labs Reviewed: Creat: 0.90 (10/24/2009)     Complete Medication List: 1)  Hydrochlorothiazide 25 Mg Tabs (Hydrochlorothiazide) .Marland Kitchen.. 1 once daily 2)  Klor-con M20 20 Meq Tbcr (Potassium chloride crys cr) .... Two tablets by mouth once daily 3)  Fish Oil 1360 Mg Caps (Omega-3 fatty acids) .... 2 caps by mouth bid 4)  Metformin Hcl 500 Mg Tabs (Metformin hcl) .... One by mouth bid 5)  Victoza 18 Mg/29ml Soln (Liraglutide) .... Inject 1.2 mg once daily 6)  Pen Needles 5/16" 31g X 8 Mm Misc (Insulin pen needle) .... Use once daily as directed  Other Orders: Tdap => 27yrs IM ([address]) Admin 1st Vaccine ([address])  Patient Instructions: 1)  Please schedule a follow-up appointment in 2 months. 2)  BMP prior to visit, ICD-9:  401.9 3)  A1c:  790.29 4)  TSH, Free T4 prior to visit, ICD-9: 704.00 5)  Serum iron, TIBC, ferritin:  704.00 Prescriptions: PEN NEEDLES 5/16" 31G X 8 MM MISC (INSULIN PEN NEEDLE) use once daily as directed  #100 x 1   Entered and Authorized by:   D. Thomos Lemons DO   Signed by:   D. Thomos Lemons DO on 11/01/2009   Method used:   [facility]         [facility] [phone][facility] (retail)       [address]       [place], [place]  [id number]       Ph: [phone] or [phone]       Fax: [phone]   RxID:   [id number] VICTOZA 18 MG/3ML SOLN (LIRAGLUTIDE) inject 1.2 mg once daily  #1 month x 3   Entered and Authorized by:   D. Thomos Lemons DO   Signed by:   D. Thomos Lemons DO on 11/01/2009   Method used:   [facility]        [facility]  [address] [phone][facility] (retail)       [address]       [place], [place]  [address]       Ph: [phone] or [phone]       Fax: [phone]   RxID:   [id number]   Current Allergies (reviewed today): No known allergies    Immunization History:  Influenza Immunization History:    Influenza:  historical (06/18/2009)  Immunizations Administered:  Tetanus Vaccine:    Vaccine Type: Tdap    Site: left deltoid    Mfr: GlaxoSmithK[employer]    Dose: 0.5 ml    Route: IM    Given by: Glendell Docker CMA    Exp. Date: 10/26/2011    Lot #: [id number]    VIS given: 07/19/07 version given November 01, 2009.     Preventive Care Screening  Mammogram:    Date:  07/09/2009    Results:  normal   Pap Smear:    Date:  07/09/2009    Results:  normal

## 2010-10-02 NOTE — Assessment & Plan Note (Signed)
Summary: epigastric pain, elevatedd lfts...em    History of Present Illness Visit Type: Initial Consult Primary GI MD: Lina Sar MD Primary Provider: Dondra Spry DO Requesting Provider: Dondra Spry DO Chief Complaint: Patient was referred for epigastric pain and elevated Lipase. Patient states that her epigastric pain is much better since she started omeprazole. No other complaints. History of Present Illness:   This is a 50 year old African American female with severe epigastric pain which started several weeks after she was put on Victoza for her diabetes. She has a history of gastroesophageal reflux and once was told to have a hiatal hernia. She denies any dysphagia. The pain was  subxiphoid and did not radiate to her back. It was relieved by eating. She took some over-the-counter antacids. Her abdominal ultrasound was essentially normal ;A CT scan of the abdomen showed a contracted gallbladder with normal common bile duct and normal-appearing pancreas. Her serum lipase was elevated at 100 but her liver function tests were normal. There is no family history of gallbladder disease. She took Prilosec 20 mg a day for about 10 days with complete relief of her symptoms.   GI Review of Systems    Reports abdominal pain.     Location of  Abdominal pain: epigastric area.    Denies acid reflux, belching, bloating, chest pain, dysphagia with liquids, dysphagia with solids, heartburn, loss of appetite, nausea, vomiting, vomiting blood, weight loss, and  weight gain.        Denies anal fissure, black tarry stools, change in bowel habit, constipation, diarrhea, diverticulosis, fecal incontinence, heme positive stool, hemorrhoids, irritable bowel syndrome, jaundice, light color stool, liver problems, rectal bleeding, and  rectal pain.    Current Medications (verified): 1)  Hydrochlorothiazide 25 Mg  Tabs (Hydrochlorothiazide) .Marland Kitchen.. 1 Once Daily 2)  Klor-Con M20 20 Meq  Tbcr (Potassium Chloride Crys  Cr) .... Two Tablets By Mouth Once Daily 3)  Omeprazole 20 Mg Cpdr (Omeprazole) .... One By Mouth Once Daily 4)  Vesicare 5 Mg Tabs (Solifenacin Succinate) .... One By Mouth Once Daily  Allergies (verified): No Known Drug Allergies  Past History:  Past Medical History: Reviewed history from 12/25/2009 and no changes required. Allergic rhinitis Hypertension Colonic polyps, hx of - hyperplastic 4/08 glucose intolerance - diabetes type II  Hyperlipidemia   Menometorrhagia        Past Surgical History: Reviewed history from 01/13/2010 and no changes required. Colon polypectomy   Hemorrhoidectomy      Family History: Family History Hypertension: mother, father mother & father with DM II         No FH of Colon Cancer:  Social History: Married 2 children - 1 biol, 1 adopted Former Smoker   Alcohol use-no   Occupation:  Advertising account planner        Daily Caffeine Use rare coffee--- no sodas  Review of Systems       The patient complains of allergy/sinus, arthritis/joint pain, and menstrual pain.  The patient denies anemia, anxiety-new, back pain, blood in urine, breast changes/lumps, change in vision, confusion, cough, coughing up blood, depression-new, fainting, fatigue, fever, headaches-new, hearing problems, heart murmur, heart rhythm changes, itching, muscle pains/cramps, night sweats, nosebleeds, pregnancy symptoms, shortness of breath, skin rash, sleeping problems, sore throat, swelling of feet/legs, swollen lymph glands, thirst - excessive , urination - excessive , urination changes/pain, urine leakage, vision changes, and voice change.         Pertinent positive and negative review of systems were noted in the  above HPI. All other ROS was otherwise negative.   Vital Signs:  Patient profile:   50 year old female Height:      65.5 inches Weight:      183.4 pounds BMI:     30.16 Pulse rate:   86 / minute Pulse rhythm:   regular BP sitting:   124 / 80  (left arm) Cuff size:    regular  Vitals Entered By: Harlow Mares CMA [name] (Jan 16, 2010 9:26 AM)  Physical Exam  General:  Well developed, well nourished, no acute distress. Mouth:  No deformity or lesions, dentition normal. Neck:  Supple; no masses or thyromegaly. Lungs:  Clear throughout to auscultation. Heart:  Regular rate and rhythm; no murmurs, rubs,  or bruits. Abdomen:  Soft, nontender and nondistended. No masses, hepatosplenomegaly or hernias noted. Normal bowel sounds. Extremities:  No clubbing, cyanosis, edema or deformities noted. Psych:  Alert and cooperative. Normal mood and affect.   Impression & Recommendations:  Problem # 1:  GERD (ICD-530.81)  Patient has severe epigastric pain with concomitant elevation of serum lipase but normal liver function tests and a normal appearing gallbladder. It is not clear whetherher symptoms were related to gastroesophageal reflux or due to symptomatic gallbladder disease.Victoza's most common side effects are pancreatitis, nausea, vomiting and constipation. I believe her symptoms were in some way related to the new medications but they have subsided since it has been discontinued. We will proceed with an upper endoscopy since she has had intermittent gastroesophageal reflux. We will rule out Barrett's esophagus and check for  H. pylori. She will stay off her PPI for now pending results of the endoscopy. I would consider a HIDA scan. We will repeat her lipase and amylase today as well.  Orders: EGD (EGD)  Problem # 2:  COLONIC POLYPS, HYPERPLASTIC, HX OF (ICD-V12.72) Patient is status post colonoscopy in April 2008 with findings of a hyperplastic polyp and rectal mucosal nodularity. She had a hemorrhoidectomy in 2008.  Other Orders: TLB-Amylase (82150-AMYL) TLB-Lipase (83690-LIPASE)  Patient Instructions: 1)  Antireflux measures. 2)  Depending on the results of the endoscopy, she may need to take PPIs daily. 3)  Consider HIDA scan depending results of  endoscopy. 4)  Repeat lipase and amylase. 5)  Copy sent to : Dr Artist Pais 6)  The medication list was reviewed and reconciled.  All changed / newly prescribed medications were explained.  A complete medication list was provided to the patient / caregiver.

## 2010-10-02 NOTE — Progress Notes (Signed)
Summary: Tx. for H-Pylori  Medications Added PYLERA 140-125-125 MG CAPS (BIS SUBCIT-METRONID-TETRACYC) Use as directed for 10 days. PRILOSEC OTC 20 MG  TBEC (OMEPRAZOLE MAGNESIUM) 1 twice daily for 10 days, as directed for treatment of H-Pylori.       ---- Converted from flag ---- ---- 01/20/2010 7:57 PM, Hart Carwin MD wrote: Linda Christian, this patient is H.Pylori positive. I asked her to call [phone] to ask for You. Please send  ??Pylera? Thanx ------------------------------  Phone Note Outgoing Call   Call placed by: Laureen Ochs LPN,  Jan 22, 2010 4:28 PM Call placed to: Patient Summary of Call: Message left for patient to callback.  Initial call taken by: Laureen Ochs LPN,  Jan 22, 2010 4:28 PM  Follow-up for Phone Call        Message left for patient to callback. Laureen Ochs LPN  Jan 23, 2010 10:52 AM  Message left for patient to callback. Laureen Ochs LPN  Jan 24, 2010 10:07 AM  Message left for patient to callback. If no call by the end of today I will mail pt. a letter. Laureen Ochs LPN  Jan 28, 2010 9:18 AM   I have mailed a letter to the patient concerning her positive H-Pylori, a phamplet on H-Pylori, instructions on taking the [facility] and a script for Pylera has been sent to her pharmacy. Pt. instructed to call back as needed.  Follow-up by: Laureen Ochs LPN,  January 29, 1609 11:30 AM    New/Updated Medications: PYLERA 140-125-125 MG CAPS (BIS SUBCIT-METRONID-TETRACYC) Use as directed for 10 days. PRILOSEC OTC 20 MG  TBEC (OMEPRAZOLE MAGNESIUM) 1 twice daily for 10 days, as directed for treatment of H-Pylori. Prescriptions: PRILOSEC OTC 20 MG  TBEC (OMEPRAZOLE MAGNESIUM) 1 twice daily for 10 days, as directed for treatment of H-Pylori.  #20 x 0   Entered by:   Laureen Ochs LPN   Authorized by:   Hart Carwin MD   Signed by:   Laureen Ochs LPN on 96/11/5407   Method used:   [facility]        [facility] [address] [phone]*  (retail)       [address]       [place], [place]  [address]       Ph: [phone] or [phone]       Fax: [phone]   RxID:   [id number] PYLERA 140-125-125 MG CAPS (BIS SUBCIT-METRONID-TETRACYC) Use as directed for 10 days.  #10 x 0   Entered by:   Laureen Ochs LPN   Authorized by:   Hart Carwin MD   Signed by:   Laureen Ochs LPN on 87/56/4332   Method used:   [facility]        [facility] [phone]* (retail)       [address]       [place], [place]  [address]       Ph: [phone] or [phone]       Fax: [phone]   RxID:   [id number]

## 2010-10-02 NOTE — Progress Notes (Signed)
Summary: Vesicar Rx  Phone Note Call from Patient Call back at [phone]   Caller: Patient Call For: D. Thomos Lemons DO Reason for Call: Refill Medication Summary of Call: patient called and left voice message wanting to know if Dr Artist Pais would provide her a rx for Vesicare she states she has taken in the past for an over active bladder and would like another rx to [facility] [address] Initial call taken by: Glendell Docker CMA,  Jan 06, 2010 10:29 AM  Follow-up for Phone Call        attempted to contact patient at [phone], no answer, voice message left informing patient rx sent to pharmacy Follow-up by: Glendell Docker CMA,  Jan 06, 2010 4:36 PM    New/Updated Medications: VESICARE 5 MG TABS (SOLIFENACIN SUCCINATE) one by mouth once daily Prescriptions: VESICARE 5 MG TABS (SOLIFENACIN SUCCINATE) one by mouth once daily  #30 x 2   Entered and Authorized by:   D. Thomos Lemons DO   Signed by:   D. Thomos Lemons DO on 01/06/2010   Method used:   [facility] to        [facility] [phone]* (retail)       [address]       [place], [place]  [address]       Ph: [phone] or [phone]       Fax: [phone]   RxID:   [id number]

## 2010-10-02 NOTE — Assessment & Plan Note (Signed)
Summary: pain in upper abd/mhf 1:45   Vital Signs:  Patient profile:   50 year old female Height:      65.5 inches Weight:      176.75 pounds BMI:     29.07 O2 Sat:      99 % on Room air Temp:     98.0 degrees F oral Pulse rate:   67 / minute Pulse rhythm:   regular Resp:     18 per minute BP sitting:   114 / 68  (right arm) Cuff size:   large  Vitals Entered By: Glendell Docker CMA (December 12, 2009 1:48 PM)  O2 Flow:  Room air CC: Rm 3- Pain in Abdomen, Abdominal pain   Primary Care Provider:  Dondra Spry DO  CC:  Rm 3- Pain in Abdomen and Abdominal pain.  History of Present Illness:  Abdominal Pain      This is a 50 year old woman who presents with Abdominal pain.  The patient denies nausea, vomiting, and anorexia.  The location of the pain is epigastric.  The pain is described as intermittent.   symptoms started 1 week ago  she has hx of gerd stomach is sour she is on victoza   Allergies (verified): No Known Drug Allergies  Past History:  Past Medical History: Allergic rhinitis Hypertension Colonic polyps, hx of - hyperplastic 4/08 glucose intolerance - diabetes type II Hyperlipidemia   Menometorrhagia       Past Surgical History: Colon polypectomy   Hemorrhoidectomy         Family History: Family History Hypertension mother with DM II         Social History: Married 2 children - 1 biol, 1 adopted Former Smoker   Alcohol use-no   Occupation:  Advertising account planner        Physical Exam  General:  alert, well-developed, and well-nourished.   Lungs:  normal respiratory effort and normal breath sounds.   Heart:  normal rate, regular rhythm, and no gallop.   Abdomen:  mild epigastric pain,  soft, no masses, no guarding, and no rigidity.     Impression & Recommendations:  Problem # 1:  ABDOMINAL PAIN, EPIGASTRIC (ICD-789.06) pt with sour stomach.  she has hx of gerd.   she recently started victoza.  I doubt pancreatitis but will rule out with pancreatic  enzymes.  restart PPI  Orders: T-Basic Metabolic Panel [id number])  Complete Medication List: 1)  Hydrochlorothiazide 25 Mg Tabs (Hydrochlorothiazide) .Marland Kitchen.. 1 once daily 2)  Klor-con M20 20 Meq Tbcr (Potassium chloride crys cr) .... Two tablets by mouth once daily 3)  Fish Oil 1360 Mg Caps (Omega-3 fatty acids) .... 2 caps by mouth bid 4)  Victoza 18 Mg/44ml Soln (Liraglutide) .... Inject 1.2 mg once daily 5)  Pen Needles 5/16" 31g X 8 Mm Misc (Insulin pen needle) .... Use once daily as directed 6)  Omeprazole 20 Mg Cpdr (Omeprazole) .... One by mouth once daily  Other Orders: T-CBC w/Diff [id number]) T-Hepatic Function [id number]) T-Lipase [id number])  Patient Instructions: 1)  Call our office if your symptoms do not  improve or gets worse. Prescriptions: OMEPRAZOLE 20 MG CPDR (OMEPRAZOLE) one by mouth once daily  #30 x 2   Entered and Authorized by:   D. Thomos Lemons DO   Signed by:   D. Thomos Lemons DO on 12/12/2009   Method used:   [facility] to        [facility] [phone]* (retail)  [address]       [place], [place]  [address]       Ph: [phone] or [phone]       Fax: [phone]   RxID:   [id number]   Current Allergies (reviewed today): No known allergies

## 2010-10-02 NOTE — Procedures (Signed)
Summary: Upper Endoscopy  Patient: Linda Christian Note: All result statuses are Final unless otherwise noted.  Tests: (1) Upper Endoscopy (EGD)   EGD Upper Endoscopy       DONE     [facility]     [address]     [place], [place]  [address]           ENDOSCOPY PROCEDURE REPORT           PATIENT:  [name], [name]  MR#:  [id number]     BIRTHDATE:  12-06-1960, 48 yrs. old  GENDER:  female           ENDOSCOPIST:  Hedwig Morton. Juanda Chance, MD     Referred by:  Thomos Lemons, DO           PROCEDURE DATE:  01/17/2010     PROCEDURE:  EGD with biopsy     ASA CLASS:  Class I     INDICATIONS:  abdominal pain severe epig pain evaluated in ER,     elevated lipase, normal gall bladder, relieved by prelosec           MEDICATIONS:   Versed 8 mg, Fentanyl 50 mcg     TOPICAL ANESTHETIC:  Exactacain Spray           DESCRIPTION OF PROCEDURE:   After the risks benefits and     alternatives of the procedure were thoroughly explained, informed     consent was obtained.  The LB GIF-H180 [id number] endoscope was     introduced through the mouth and advanced to the second portion of     the duodenum, without limitations.  The instrument was slowly     withdrawn as the mucosa was fully examined.     <<PROCEDUREIMAGES>>           A hiatal hernia was found (see image2). 3 cm partially reducible     hiatal hernia  Normal GE junction was noted. With standard     forceps, a biopsy was obtained and sent to pathology (see image7     and image6).  The stomach was entered and closely examined. The     antrum, angularis, and lesser curvature were well visualized,     including a retroflexed view of the cardia and fundus. The stomach     wall was normally distensable. The scope passed easily through the     pylorus into the duodenum. With standard forceps, a biopsy was     obtained and sent to pathology (see image3, image4, and image5).     Retroflexed views revealed no abnormalities.    The scope was then  withdrawn from the patient and the procedure completed.           COMPLICATIONS:  None           ENDOSCOPIC IMPRESSION:     1) Hiatal hernia     2) Normal GE junction     3) Normal stomach     s/p biopsies g-e junction, abd. pain was likely due to GERD     RECOMMENDATIONS:     1) Await biopsy results     Prelosec 20 mg po qd, #30 6 refills           REPEAT EXAM:  In 0 year(s) for.           ______________________________     Hedwig Morton. Juanda Chance, MD           CC:  n.     eSIGNED:   Hedwig Morton. Daneya Hartgrove at 01/17/2010 09:21 AM           Bre, [place], [id number]  Note: An exclamation mark (!) indicates a result that was not dispersed into the flowsheet. Document Creation Date: 01/17/2010 1:52 PM _______________________________________________________________________  (1) Order result status: Final Collection or observation date-time: 01/17/2010 09:14 Requested date-time:  Receipt date-time:  Reported date-time:  Referring Physician:   Ordering Physician: Lina Sar [phone]) Specimen Source:  Source: Launa Grill Order Number: [id number] Lab site:

## 2010-10-02 NOTE — Progress Notes (Signed)
Summary: Pharmacy Clarification  Phone Note Refill Request Message from:  Fax from Pharmacy on December 05, 2009 8:14 AM  Refills Requested: Medication #1:  KLOR-CON M20 20 MEQ  TBCR two tablets by mouth once daily   Dosage confirmed as above?Dosage Confirmed   Brand Name Necessary? No   Supply Requested: 1 month   Last Refilled: [facility]/2009 [facility] [address] [place] fax [phone]   Method Requested: Electronic Next Appointment Scheduled: none Initial call taken by: Roselle Locus,  December 05, 2009 8:15 AM  Follow-up for Phone Call        left message on machine at home and work to return call.  90 day supply x 3 rf was sent to [facility] on 11/19/09.  Does she still need this sent locally?  Also needs to schedule follow up in May per last ofc visit with Dr. Artist Pais.  Mervin Kung CMA  December 05, 2009 9:22 AM   Additional Follow-up for Phone Call Additional follow up Details #1::        attempted to contact patient at [phone], no answer, voice message left for patient to call back for pharmacy clarification. Additional Follow-up by: Glendell Docker CMA,  December 05, 2009 4:01 PM    Additional Follow-up for Phone Call Additional follow up Details #2::    patient returned phone call regarding rx she is using [facility] and has recived the medication already, and ask that we disregard the previous [facility] Follow-up by: Glendell Docker CMA,  December 05, 2009 4:36 PM

## 2010-11-26 ENCOUNTER — Other Ambulatory Visit: Payer: Self-pay | Admitting: *Deleted

## 2010-11-26 DIAGNOSIS — E876 Hypokalemia: Secondary | ICD-10-CM

## 2010-11-26 DIAGNOSIS — I10 Essential (primary) hypertension: Secondary | ICD-10-CM

## 2010-11-26 NOTE — Telephone Encounter (Signed)
Patient called and left voice message stating her insurance has changed and she is requesting refills be faxed for her HCTZ, and Potassium to her new pharmacy [facility] at [phone]

## 2010-11-27 NOTE — Telephone Encounter (Signed)
Ok for refill x 5 

## 2010-11-28 MED ORDER — HYDROCHLOROTHIAZIDE 25 MG PO TABS: 25.0000 mg | ORAL_TABLET | Freq: Every day | ORAL | Status: DC

## 2010-11-28 MED ORDER — POTASSIUM CHLORIDE CRYS ER 20 MEQ PO TBCR: 20.0000 meq | EXTENDED_RELEASE_TABLET | Freq: Two times a day (BID) | ORAL | Status: DC

## 2010-12-01 NOTE — Telephone Encounter (Signed)
Rx refaxed to requested phone number successfully

## 2010-12-12 ENCOUNTER — Other Ambulatory Visit: Payer: Self-pay | Admitting: Obstetrics and Gynecology

## 2010-12-12 LAB — HM PAP SMEAR: HM Pap smear: NORMAL

## 2010-12-19 ENCOUNTER — Other Ambulatory Visit: Payer: Self-pay | Admitting: Internal Medicine

## 2010-12-19 DIAGNOSIS — L659 Nonscarring hair loss, unspecified: Secondary | ICD-10-CM

## 2010-12-19 DIAGNOSIS — E119 Type 2 diabetes mellitus without complications: Secondary | ICD-10-CM

## 2010-12-19 DIAGNOSIS — I10 Essential (primary) hypertension: Secondary | ICD-10-CM

## 2010-12-20 ENCOUNTER — Encounter: Payer: Self-pay | Admitting: Internal Medicine

## 2010-12-22 ENCOUNTER — Encounter: Payer: Self-pay | Admitting: Internal Medicine

## 2010-12-23 ENCOUNTER — Encounter: Payer: Self-pay | Admitting: Internal Medicine

## 2010-12-23 ENCOUNTER — Ambulatory Visit (INDEPENDENT_AMBULATORY_CARE_PROVIDER_SITE_OTHER): Payer: BC Managed Care – PPO | Admitting: Internal Medicine

## 2010-12-23 ENCOUNTER — Ambulatory Visit: Payer: Self-pay | Admitting: Internal Medicine

## 2010-12-23 DIAGNOSIS — R7309 Other abnormal glucose: Secondary | ICD-10-CM

## 2010-12-23 DIAGNOSIS — J309 Allergic rhinitis, unspecified: Secondary | ICD-10-CM

## 2010-12-23 DIAGNOSIS — I10 Essential (primary) hypertension: Secondary | ICD-10-CM

## 2010-12-23 LAB — BASIC METABOLIC PANEL
BUN: 8 mg/dL (ref 6–23)
Calcium: 9.6 mg/dL (ref 8.4–10.5)
Creat: 0.81 mg/dL (ref 0.40–1.20)

## 2010-12-23 LAB — HEMOGLOBIN A1C
Hgb A1c MFr Bld: 6.4 % — ABNORMAL HIGH (ref <5.7)
Mean Plasma Glucose: 137 mg/dL — ABNORMAL HIGH (ref <117)

## 2010-12-23 LAB — TSH: TSH: 1.02 u[IU]/mL (ref 0.350–4.500)

## 2010-12-23 MED ORDER — FLUTICASONE PROPIONATE 50 MCG/ACT NA SUSP: 2.0000 | Freq: Every day | NASAL | Status: DC

## 2010-12-23 NOTE — Assessment & Plan Note (Signed)
Pt experiencing exacerbation due to high tree pollen counts.  Good control with generic flonase in the past.

## 2010-12-23 NOTE — Progress Notes (Signed)
Subjective:    Patient ID: Linda Christian, female    DOB: [birth year], 50 y.o.   MRN: [id number]  Diabetes She presents for her follow-up diabetic visit. She has type 2 diabetes mellitus. Her disease course has been stable. There are no diabetic associated symptoms. Symptoms are stable. There are no diabetic complications. Risk factors for coronary artery disease include hypertension and diabetes mellitus. Current diabetic treatment includes diet. She is compliant with treatment all of the time. Her weight is stable. She is following a diabetic and generally healthy diet.   Patient reports starting a no meat diet. Her protein sources are eggs, nuts and soy products.  She notes significant improvement in her GERD symptoms; they have completely resolved with dietary change.  She exercises occasionally.  Her weight is stable. A1c is stable.  She has not been taking her onglyza.  Patient also complains of allergy flare acute to increased pollen. Flonase has worked well for her in the past.  Review of Systems Less fatigue    Past Medical History  Diagnosis Date  . Allergy     allergic rhinitis  . Hypertension   . Hyperlipidemia   . Glucose intolerance (impaired glucose tolerance)   . History of colon polyps 11/2006    hyperplastic  . Menorrhagia   . Routine general medical examination at a health care facility     History   Social History  . Marital Status: Married    Spouse Name: N/A    Number of Children: 2  . Years of Education: N/A   Occupational History  .  [employer]   Social History Main Topics  . Smoking status: Never Smoker   . Smokeless tobacco: Not on file  . Alcohol Use: No  . Drug Use: Not on file  . Sexually Active: Not on file   Other Topics Concern  . Not on file   Social History Narrative   Caffeine use: Rare coffee, no sodas1 biological child, 1 adopted child    Past Surgical History  Procedure Date  . Polypectomy   . Hemorrhoid surgery      Family History  Problem Relation Age of Onset  . Hypertension Mother   . Diabetes Mother     type II  . Hypertension Father   . Diabetes Father     type II  . Cancer Neg Hx     negative for colon cancer    No Known Allergies  Current Outpatient Prescriptions on File Prior to Visit  Medication Sig Dispense Refill  . hydrochlorothiazide 25 MG tablet Take 1 tablet (25 mg total) by mouth daily.  90 tablet  2  . potassium chloride SA (KLOR-CON M20) 20 MEQ tablet Take 1 tablet (20 mEq total) by mouth 2 (two) times daily.  90 tablet  2  . clotrimazole-betamethasone (LOTRISONE) cream Apply topically. As needed.       Marland Kitchen omeprazole (PRILOSEC) 20 MG capsule Take 20 mg by mouth daily.        . saxagliptin HCl (ONGLYZA) 5 MG TABS tablet Take 5 mg by mouth daily.          BP 124/80  Pulse 93  Temp(Src) 98.4 F (36.9 C) (Oral)  Resp 18  Ht 5' 5.5" (1.664 m)  Wt 183 lb (83.008 kg)  BMI 29.99 kg/m2  SpO2 99%  LMP 11/30/2010    Objective:   Physical Exam  Constitutional: She appears well-developed and well-nourished.  Cardiovascular: Normal rate, regular rhythm  and normal heart sounds.   Pulmonary/Chest: Effort normal and breath sounds normal. She has no wheezes. She has no rales.  Skin: Skin is dry.  Psychiatric: She has a normal mood and affect. Her behavior is normal.          Assessment & Plan:

## 2010-12-23 NOTE — Patient Instructions (Signed)
Please complete the following lab tests before your next follow up appointment: BMET,  HgA1c - 790.29

## 2010-12-23 NOTE — Assessment & Plan Note (Signed)
Well controlled.   No change in medication BP: 124/80 mmHg  Lab Results  Component Value Date   CREATININE 0.81 12/22/2010

## 2010-12-23 NOTE — Assessment & Plan Note (Addendum)
Stable. Continue dietary/lifestyle changes.  We discussed further reducing carbohydrate intake.  Monitor A1c.  We discussed starting DPP 4 inhibitor if worsening diabetes control. Victoza stopped due to possible mild pancreatitis. Pt unable to tolerate metformin due to GI side effects.  Lab Results  Component Value Date   HGBA1C 6.4* 12/22/2010

## 2010-12-29 ENCOUNTER — Ambulatory Visit: Payer: Self-pay | Admitting: Internal Medicine

## 2011-01-13 NOTE — Op Note (Signed)
NAME:  Linda Christian, Linda Christian                ACCOUNT NO.:  [id number]   MEDICAL RECORD NO.:  [id number]          PATIENT TYPE:  AMB   LOCATION:  DAY                          FACILITY:  [facility]   PHYSICIAN:  Wilmon Arms. Corliss Skains, M.D. DATE OF BIRTH:  23-Aug-1961   DATE OF PROCEDURE:  04/08/2007  DATE OF DISCHARGE:                               OPERATIVE REPORT   PREOPERATIVE DIAGNOSIS:  External hemorrhoids.   POSTOPERATIVE DIAGNOSIS:  External hemorrhoids.   PROCEDURE PERFORMED:  External hemorrhoidectomy.   SURGEON:  Wilmon Arms. Corliss Skains, M.D., FACS   ANESTHESIA:  General.   INDICATIONS:  The patient is a 50 year old female with a long history of  external hemorrhoids.  She has also had some painless bleeding with  bowel movements which resolved.  Colonoscopy this year was unremarkable.  The patient presents for hemorrhoidectomy.   DESCRIPTION OF PROCEDURE:  The patient was brought to the operating room  and placed in the supine position on the operating room table.  After an  adequate level of general anesthesia was obtained, the patient's legs  were placed in yellow fin stirrups in lithotomy position.  Her perineum  was prepped with Betadine and draped in a sterile fashion.  The perianal  region was infiltrated with 0.25% percent Marcaine.  Her anus was  dilated up to three fingers.  The silver bullet retractor was inserted.  The patient had a posterior external hemorrhoid which tracked into a  small internal hemorrhoid.  There was no other internal hemorrhoid  disease.  There was no perineal abscess or fistula.  The posterior  hemorrhoid was grasped with a Pennington clamp.  The mucosa and skin was  scored with a knife.  Cautery was then used to dissect the hemorrhoidal  tissue off of the sphincter muscle.  Hemostasis was obtained with  cautery.  The mucosa and skin were then reapproximated with a running 3-  0 Vicryl suture.  The most external half a centimeter was left open to  allow  drainage.  A small piece of Gelfoam was tucked underneath the  closure line.   The retractor was then turned around and the anterior hemorrhoid was  grasped with a Pennington clamp.  This was excised in a similar fashion  and closed with a 3-0 Vicryl. Gelfoam with lidocaine jelly was then  inserted into the anal canal.  A dry dressing was applied.  The patient  was then extubated and brought to the recovery room in stable condition.  All sponge, instrument, and needle counts were correct.      Wilmon Arms. Tsuei, M.D.  Electronically Signed     MKT/MEDQ  D:  04/08/2007  T:  04/08/2007  Job:  [id number]

## 2011-02-21 IMAGING — CT CT ABDOMEN W/ CM
3 of 12 series · 13 of 46 positions shown, 19 images · IV contrast (agent unspecified)
Comparison: Ultrasound of the abdomen of 09/19/2008

CLINICAL DATA: Epigastric pain, elevated lipase

CT ABDOMEN WITH CONTRAST
TECHNIQUE: Multidetector CT imaging of the abdomen was performed
using the standard protocol following bolus administration of
intravenous contrast.
Contrast: 100 ml Qmnipaque-T99

[Series 4: arterial 2.0 b25f · axial · arterial · 0.65mm/px · z∈[-591,-441]mm · 4 of 176 slices shown]
[im 26/176  soft-tissue]
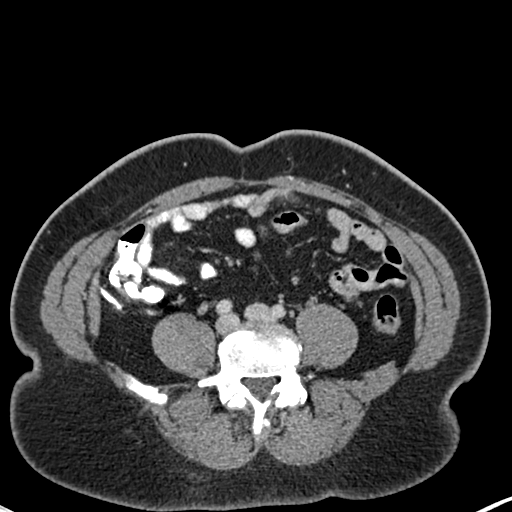
[im 51/176  soft-tissue]
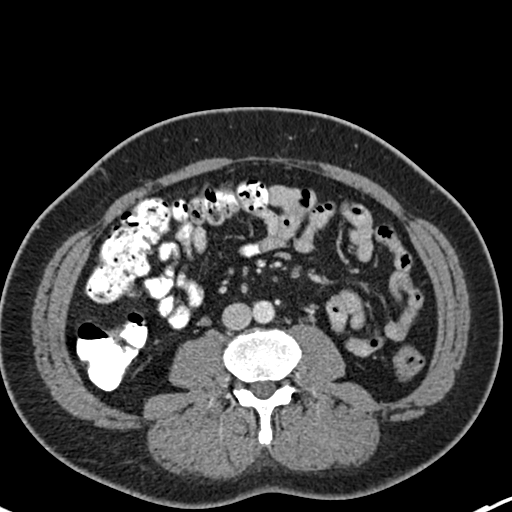
[im 76/176  soft-tissue]
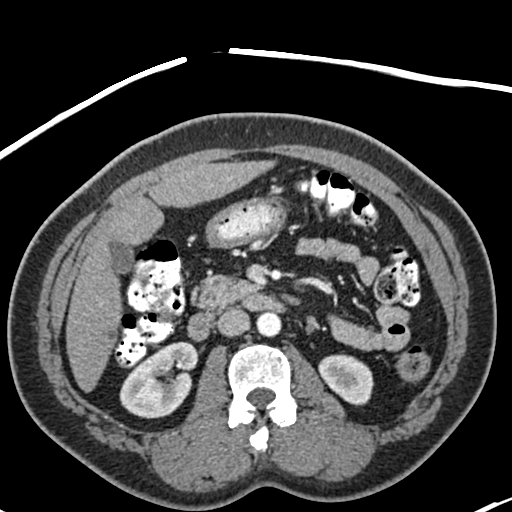
[im 101/176  soft-tissue]
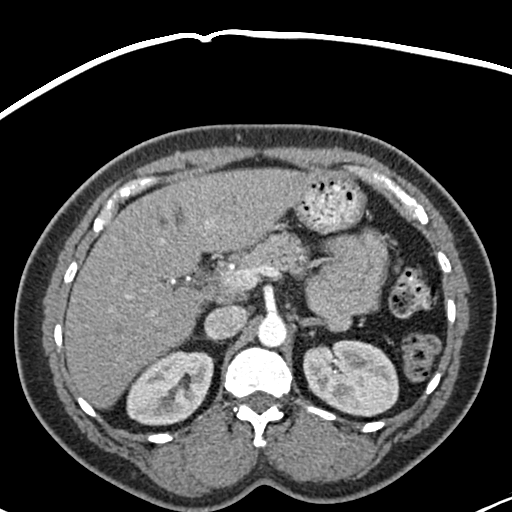

[Series 6: arterial 3.0 coronal · coronal · arterial · 0.62mm/px · 2 of 89 slices shown, 3 images]
[im 30/89  soft-tissue]
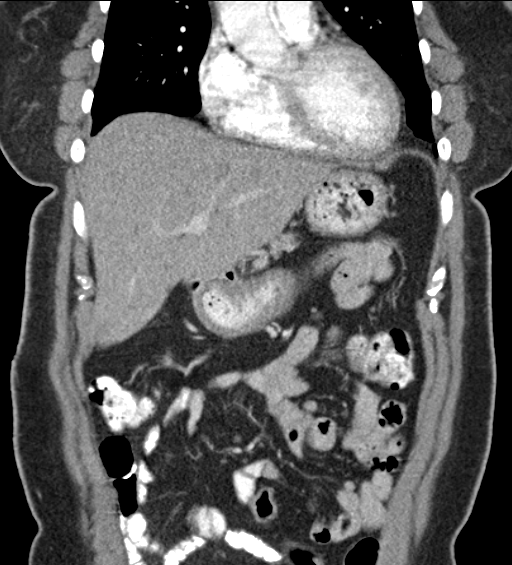
[im 30/89  bone]
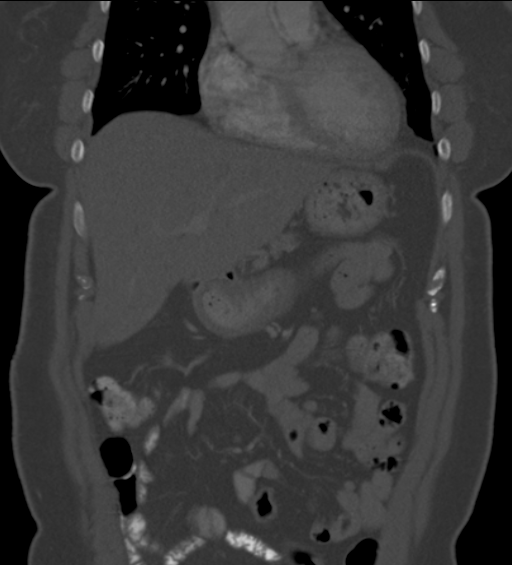
[im 59/89  soft-tissue]
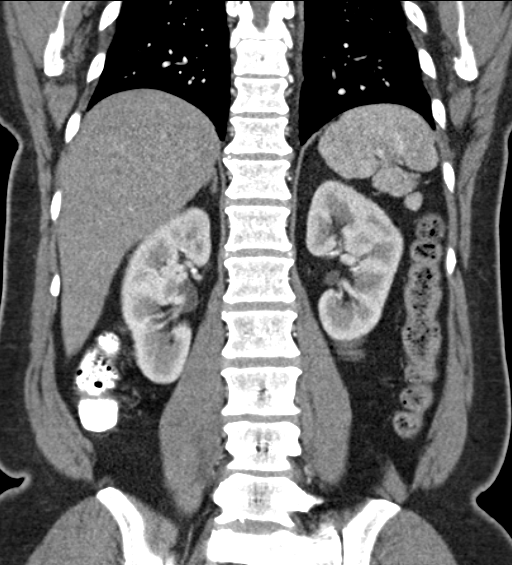

[Series 11: venous pancreas 2.0 thins · axial · portal-venous · 0.65mm/px · z∈[-599,-335]mm · 7 of 176 slices shown, 12 images]
[im 22/176  soft-tissue]
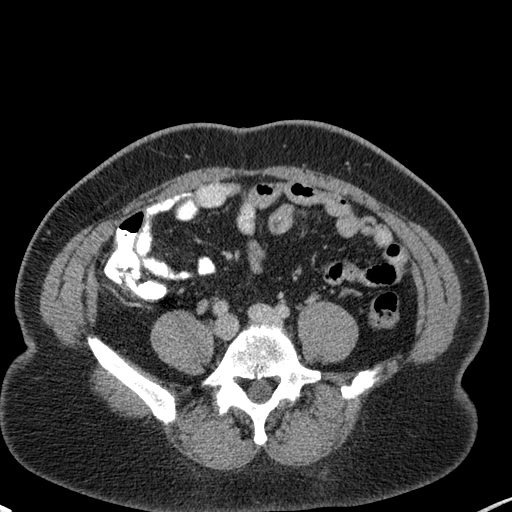
[im 22/176  bone]
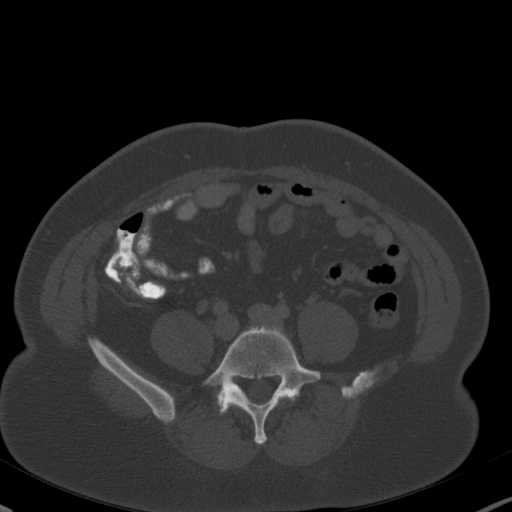
[im 44/176  soft-tissue]
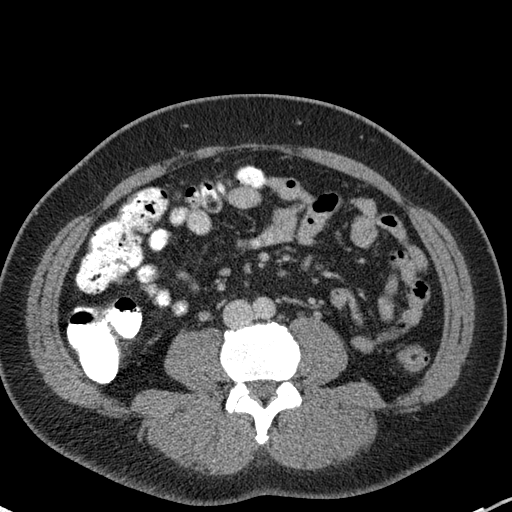
[im 66/176  soft-tissue]
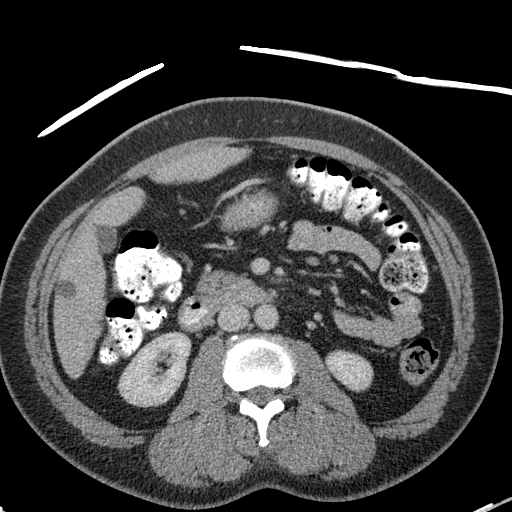
[im 88/176  soft-tissue]
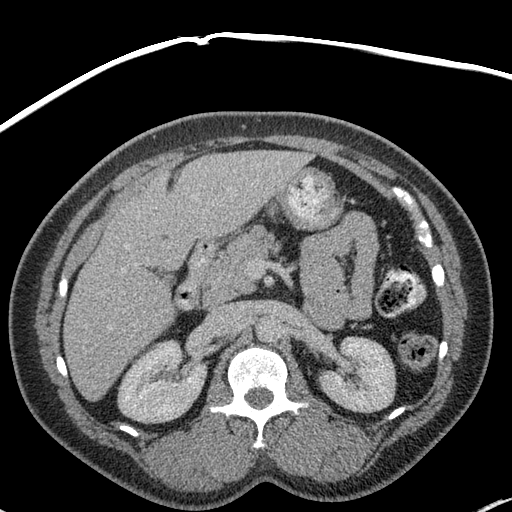
[im 88/176  lung]
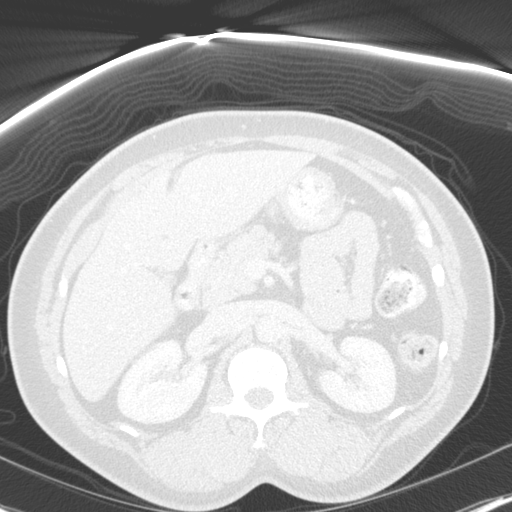
[im 110/176  soft-tissue]
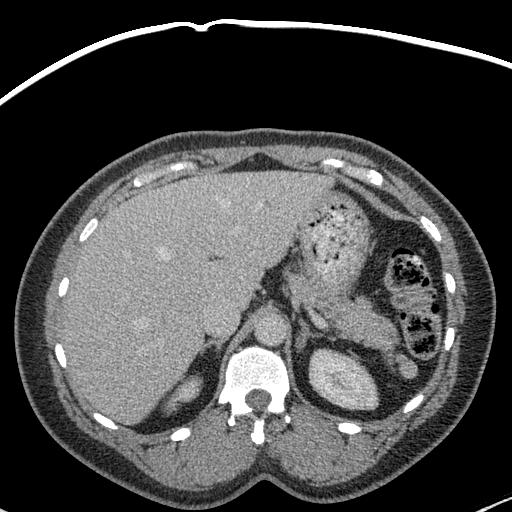
[im 110/176  lung]
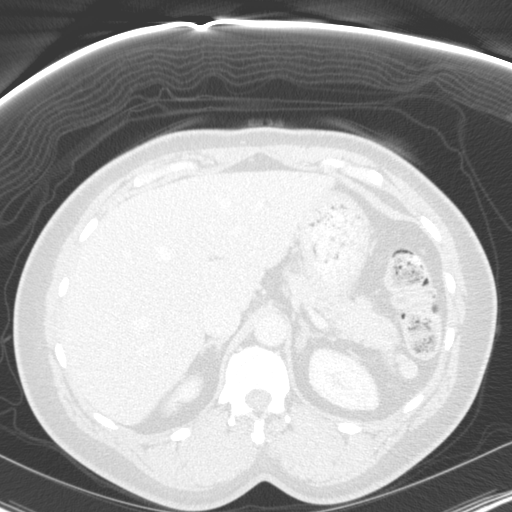
[im 132/176  soft-tissue]
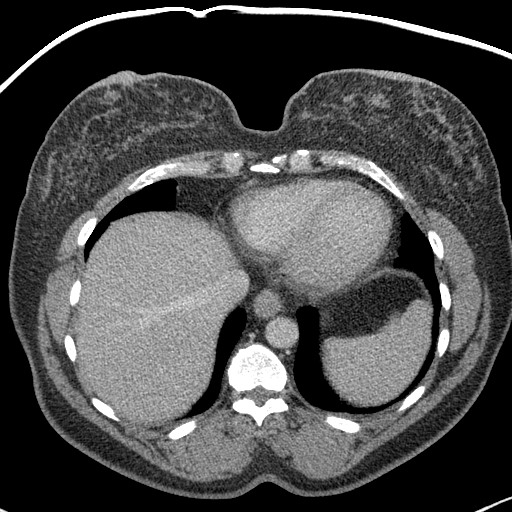
[im 132/176  lung]
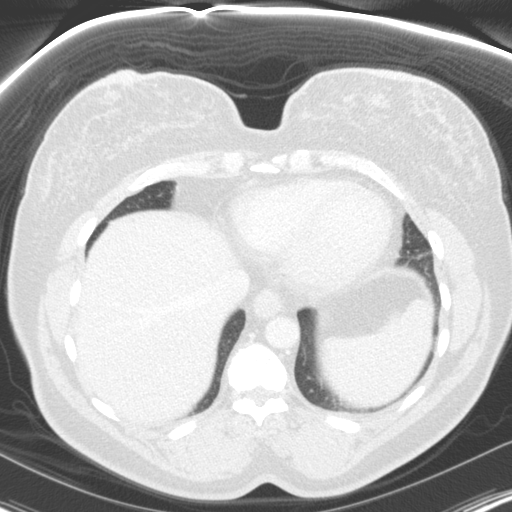
[im 154/176  soft-tissue]
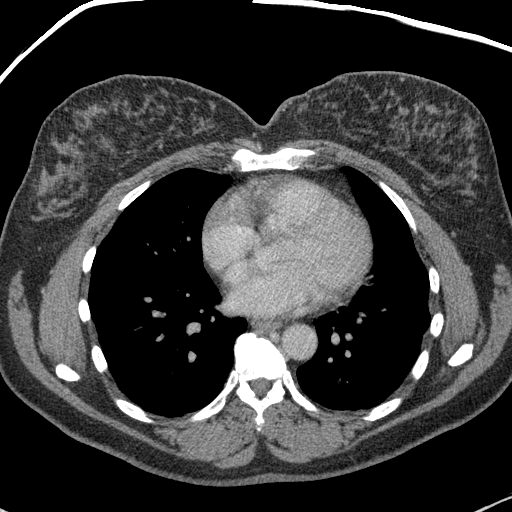
[im 154/176  lung]
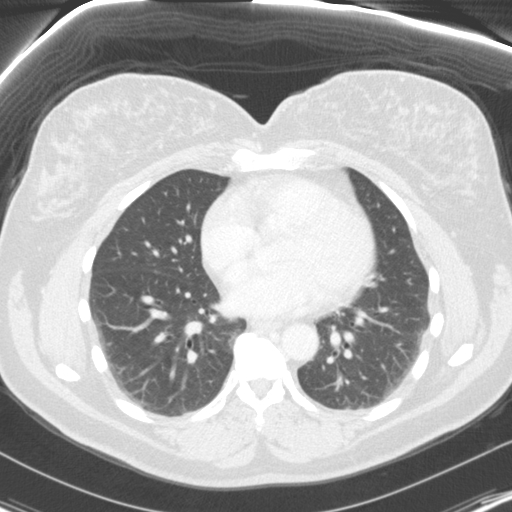

[13 of 46 positions shown; findings below may reference images not displayed]

FINDINGS: On the arterial phase the liver enhances with several
small low attenuation structures which persists on delayed images,
most consistent with small hepatic cysts.  The gallbladder is
contracted and no calcified gallstones are seen and the common bile
duct is not dilated.  The pancreas is normal in size and the
pancreatic duct is not dilated.  The peripancreatic fat planes
appear well preserved.  A nodular lesion is noted near the tail of
the pancreas but separate from the tail the pancreas.  This nodular
soft tissue has attenuation similar to the adjacent spleen and this
may represent a small splenule.  The right adrenal gland is normal
in size.  A probable incidental left adrenal adenoma is present.
The kidneys enhance with no mass or calculus and no hydronephrosis
is seen.

The celiac axis, SMA, renal arteries, and IMA all opacify normally.
On portal venous phase the liver and the pancreas again appear
grossly normal.  No pancreatic lesion is identified.  No bony
abnormality is seen.
IMPRESSION: No abnormality of the pancreas is seen.  The pancreatic duct is not
dilated.  No vascular abnormality is seen.

## 2011-05-14 ENCOUNTER — Ambulatory Visit: Payer: BC Managed Care – PPO | Admitting: Internal Medicine

## 2011-05-29 ENCOUNTER — Ambulatory Visit: Payer: BC Managed Care – PPO | Admitting: Internal Medicine

## 2011-06-15 LAB — DIFFERENTIAL
Basophils Absolute: 0
Basophils Relative: 1
Monocytes Relative: 7
Neutro Abs: 3.1
Neutrophils Relative %: 48

## 2011-06-15 LAB — BASIC METABOLIC PANEL
BUN: 6
Chloride: 102
Creatinine, Ser: 0.89
GFR calc Af Amer: >60
GFR calc non Af Amer: >60
Potassium: 3.3 — ABNORMAL LOW

## 2011-06-15 LAB — CBC
MCHC: 34.1
Platelets: 258
RBC: 4.28

## 2011-06-15 LAB — PREGNANCY, URINE: Preg Test, Ur: NEGATIVE

## 2011-07-29 ENCOUNTER — Telehealth: Payer: Self-pay | Admitting: Internal Medicine

## 2011-07-29 DIAGNOSIS — Z1329 Encounter for screening for other suspected endocrine disorder: Secondary | ICD-10-CM

## 2011-07-29 DIAGNOSIS — E785 Hyperlipidemia, unspecified: Secondary | ICD-10-CM

## 2011-07-29 NOTE — Telephone Encounter (Signed)
Patient made a follow up visit for 08-10-11. She is unsure if she needs labs prior to visit.

## 2011-07-29 NOTE — Telephone Encounter (Signed)
Patient is scheduled for 08/10/2011, what blood work is needed prior to appointment?

## 2011-07-29 NOTE — Telephone Encounter (Signed)
Cbc, chem7, a1c, urine microalbumin 250.0 and lipid/lft 272.4

## 2011-07-30 NOTE — Telephone Encounter (Signed)
Lab orders entered for December 2012. 

## 2011-08-06 ENCOUNTER — Other Ambulatory Visit: Payer: Self-pay | Admitting: *Deleted

## 2011-08-06 DIAGNOSIS — Z1329 Encounter for screening for other suspected endocrine disorder: Secondary | ICD-10-CM

## 2011-08-06 DIAGNOSIS — E785 Hyperlipidemia, unspecified: Secondary | ICD-10-CM

## 2011-08-06 LAB — CBC
HCT: 39.6 % (ref 36.0–46.0)
Hemoglobin: 13 g/dL (ref 12.0–15.0)
MCH: 29.5 pg (ref 26.0–34.0)
MCHC: 32.8 g/dL (ref 30.0–36.0)
MCV: 89.8 fL (ref 78.0–100.0)
Platelets: 238 K/uL (ref 150–400)
RBC: 4.41 MIL/uL (ref 3.87–5.11)
RDW: 12.8 % (ref 11.5–15.5)
WBC: 7 K/uL (ref 4.0–10.5)

## 2011-08-06 LAB — BASIC METABOLIC PANEL
CO2: 33 mEq/L — ABNORMAL HIGH (ref 19–32)
Chloride: 100 mEq/L (ref 96–112)
Glucose, Bld: 101 mg/dL — ABNORMAL HIGH (ref 70–99)
Potassium: 4.1 mEq/L (ref 3.5–5.3)
Sodium: 142 mEq/L (ref 135–145)

## 2011-08-06 LAB — LIPID PANEL
Cholesterol: 187 mg/dL (ref 0–200)
HDL: 64 mg/dL
LDL Cholesterol: 98 mg/dL (ref 0–99)
Total CHOL/HDL Ratio: 2.9 ratio
Triglycerides: 125 mg/dL
VLDL: 25 mg/dL (ref 0–40)

## 2011-08-06 LAB — HEPATIC FUNCTION PANEL
ALT: 38 U/L — ABNORMAL HIGH (ref 0–35)
AST: 50 U/L — ABNORMAL HIGH (ref 0–37)
Albumin: 4.7 g/dL (ref 3.5–5.2)
Alkaline Phosphatase: 49 U/L (ref 39–117)
Bilirubin, Direct: 0.2 mg/dL (ref 0.0–0.3)
Indirect Bilirubin: 0.5 mg/dL (ref 0.0–0.9)
Total Bilirubin: 0.7 mg/dL (ref 0.3–1.2)
Total Protein: 8.1 g/dL (ref 6.0–8.3)

## 2011-08-07 LAB — MICROALBUMIN / CREATININE URINE RATIO
Creatinine, Urine: 238.9 mg/dL
Microalb Creat Ratio: 8 mg/g (ref 0.0–30.0)
Microalb, Ur: 1.92 mg/dL — ABNORMAL HIGH (ref 0.00–1.89)

## 2011-08-10 ENCOUNTER — Encounter: Payer: Self-pay | Admitting: Internal Medicine

## 2011-08-10 ENCOUNTER — Ambulatory Visit (INDEPENDENT_AMBULATORY_CARE_PROVIDER_SITE_OTHER): Payer: BC Managed Care – PPO | Admitting: Internal Medicine

## 2011-08-10 DIAGNOSIS — R945 Abnormal results of liver function studies: Secondary | ICD-10-CM | POA: Insufficient documentation

## 2011-08-10 DIAGNOSIS — E785 Hyperlipidemia, unspecified: Secondary | ICD-10-CM

## 2011-08-10 DIAGNOSIS — I10 Essential (primary) hypertension: Secondary | ICD-10-CM

## 2011-08-10 DIAGNOSIS — E876 Hypokalemia: Secondary | ICD-10-CM

## 2011-08-10 DIAGNOSIS — R7309 Other abnormal glucose: Secondary | ICD-10-CM

## 2011-08-10 DIAGNOSIS — K769 Liver disease, unspecified: Secondary | ICD-10-CM

## 2011-08-10 MED ORDER — HYDROCHLOROTHIAZIDE 25 MG PO TABS: 25.0000 mg | ORAL_TABLET | Freq: Every day | ORAL | Status: DC

## 2011-08-10 NOTE — Assessment & Plan Note (Signed)
Asx. Repeat ast/alt

## 2011-08-10 NOTE — Assessment & Plan Note (Signed)
Isolated minimal elevation. Asx. Obtain ionized calcium

## 2011-08-10 NOTE — Patient Instructions (Signed)
Please schedule chem7, a1c 250.0 prior to next visit 

## 2011-08-10 NOTE — Assessment & Plan Note (Signed)
Current good control

## 2011-08-10 NOTE — Progress Notes (Signed)
  Subjective:    Patient ID: Linda Christian, female    DOB: 05-Oct-1960, 50 y.o.   MRN: [id number]  HPI Pt presents to clinic for followup of multiple medical problems. Reviewed mildly elevated calcium level previously nl and mildly elevated ast/alt. asx without complaint. BP normotensive. Mammogram appt pending. States had diabetic foot exam this year. A1c minimally elevated. Less exercise recently. Off diabetic medication as was intolerant of metformin.   Past Medical History  Diagnosis Date  . Allergy     allergic rhinitis  . Hypertension   . Hyperlipidemia   . Glucose intolerance (impaired glucose tolerance)   . History of colon polyps 11/2006    hyperplastic  . Menorrhagia   . Routine general medical examination at a health care facility    Past Surgical History  Procedure Date  . Polypectomy   . Hemorrhoid surgery     reports that she has never smoked. She has never used smokeless tobacco. She reports that she does not drink alcohol. Her drug history not on file. family history includes Diabetes in her father and mother and Hypertension in her father and mother.  There is no history of Cancer. No Known Allergies   Review of Systems see hpi     Objective:   Physical Exam  Physical Exam  Nursing note and vitals reviewed. Constitutional: Appears well-developed and well-nourished. No distress.  HENT:  Head: Normocephalic and atraumatic.  Right Ear: External ear normal.  Left Ear: External ear normal.  Eyes: Conjunctivae are normal. No scleral icterus.  Neck: Neck supple. Carotid bruit is not present.  Cardiovascular: Normal rate, regular rhythm and normal heart sounds.  Exam reveals no gallop and no friction rub.   No murmur heard. Pulmonary/Chest: Effort normal and breath sounds normal. No respiratory distress. He has no wheezes. no rales.  Lymphadenopathy:    He has no cervical adenopathy.  Neurological:Alert.  Skin: Skin is warm and dry. Not diaphoretic.    Psychiatric: Has a normal mood and affect.        Assessment & Plan:

## 2011-08-10 NOTE — Assessment & Plan Note (Signed)
Normotensive and stable. Continue current regimen. Monitor bp as outpt and followup in clinic as scheduled.  

## 2011-08-10 NOTE — Assessment & Plan Note (Signed)
Minimally suboptimal. Recommend diabetic diet, regular exercise and wt loss.

## 2011-08-14 ENCOUNTER — Other Ambulatory Visit: Payer: Self-pay | Admitting: Obstetrics and Gynecology

## 2011-08-14 DIAGNOSIS — N6452 Nipple discharge: Secondary | ICD-10-CM

## 2011-08-18 ENCOUNTER — Ambulatory Visit
Admission: RE | Admit: 2011-08-18 | Discharge: 2011-08-18 | Disposition: A | Discharge status: Home / Self Care | Payer: BC Managed Care – PPO | Source: Ambulatory Visit | Attending: Obstetrics and Gynecology | Admitting: Obstetrics and Gynecology

## 2011-08-18 ENCOUNTER — Other Ambulatory Visit: Payer: Self-pay | Admitting: Obstetrics and Gynecology

## 2011-08-18 DIAGNOSIS — N6452 Nipple discharge: Secondary | ICD-10-CM

## 2011-09-01 DIAGNOSIS — N6452 Nipple discharge: Secondary | ICD-10-CM

## 2011-09-01 HISTORY — DX: Nipple discharge: N64.52

## 2011-09-14 ENCOUNTER — Encounter (INDEPENDENT_AMBULATORY_CARE_PROVIDER_SITE_OTHER): Payer: Self-pay | Admitting: Surgery

## 2011-09-15 ENCOUNTER — Ambulatory Visit (INDEPENDENT_AMBULATORY_CARE_PROVIDER_SITE_OTHER): Payer: BC Managed Care – PPO | Admitting: Surgery

## 2011-09-15 ENCOUNTER — Encounter (INDEPENDENT_AMBULATORY_CARE_PROVIDER_SITE_OTHER): Payer: Self-pay | Admitting: Surgery

## 2011-09-15 VITALS — BP 126/88 | HR 80 | Temp 98.0°F | Resp 16 | Ht 65.0 in | Wt 183.6 lb

## 2011-09-15 DIAGNOSIS — N6459 Other signs and symptoms in breast: Secondary | ICD-10-CM

## 2011-09-15 DIAGNOSIS — N6452 Nipple discharge: Secondary | ICD-10-CM | POA: Insufficient documentation

## 2011-09-15 NOTE — Patient Instructions (Signed)
We will schedule your surgery today. 

## 2011-09-15 NOTE — Progress Notes (Signed)
Patient ID: Linda Christian, female   DOB: 11/22/1960, 51 y.o.   MRN: [id number]  Chief Complaint  Patient presents with  . Other    est pt new prob- eval nipple discharge    HPI Linda Christian is a 51 y.o. female.  Referred by Dr. Wei Chen Lin for bloody nipple discharge HPI A 51-year-old female in good health who presents with approximately 7 months of clear drainage from her left nipple. Over the last couple of weeks this has become bloody in appearance. Her last mammogram was last year and was read as normal. She underwent a diagnostic left mammogram on December 18 which showed possible asymmetry in the retroareolar left breast. She underwent a left breast ductogram on the same day which showed a lobulated 1 cm intraductal mass in the medial lower part of the retroareolar region. She presents now for discussion for excision. Past Medical History  Diagnosis Date  . Allergy     allergic rhinitis  . Hypertension   . Hyperlipidemia   . Glucose intolerance (impaired glucose tolerance)   . History of colon polyps 11/2006    hyperplastic  . Menorrhagia   . Routine general medical examination at a health care facility   . Nipple discharge   . Bloody discharge from nipple - central left nipple at 9:00 09/15/2011    Past Surgical History  Procedure Date  . Polypectomy   . Hemorrhoid surgery     Family History  Problem Relation Age of Onset  . Hypertension Mother   . Diabetes Mother     type II  . Hypertension Father   . Diabetes Father     type II  . Cancer Neg Hx     negative for colon cancer    Social History History  Substance Use Topics  . Smoking status: Former Smoker    Quit date: 08/31/1993  . Smokeless tobacco: Never Used  . Alcohol Use: No    No Known Allergies  Current Outpatient Prescriptions  Medication Sig Dispense Refill  . fluticasone (FLONASE) 50 MCG/ACT nasal spray 2 sprays by Nasal route daily.  16 g  5  . hydrochlorothiazide (HYDRODIURIL) 25 MG  tablet Take 1 tablet (25 mg total) by mouth daily.  90 tablet  2    Review of Systems Review of Systems  Constitutional: Negative for fever, chills and unexpected weight change.  HENT: Negative for hearing loss, congestion, sore throat, trouble swallowing and voice change.   Eyes: Negative for visual disturbance.  Respiratory: Negative for cough and wheezing.   Cardiovascular: Negative for chest pain, palpitations and leg swelling.  Gastrointestinal: Negative for nausea, vomiting, abdominal pain, diarrhea, constipation, blood in stool, abdominal distention and anal bleeding.  Genitourinary: Negative for hematuria, vaginal bleeding and difficulty urinating.  Musculoskeletal: Negative for arthralgias.  Skin: Negative for rash and wound.  Neurological: Negative for seizures, syncope and headaches.  Hematological: Negative for adenopathy. Does not bruise/bleed easily.  Psychiatric/Behavioral: Negative for confusion.  Menarche - age 13 First pregnancy age33 Breastfeed - no OCP - 2 years LMP - last week FH - negative for breast cancer  Filed Vitals:   09/15/11 0903  BP: 126/88  Pulse: 80  Temp: 98 F (36.7 C)  Resp: 16    Physical Exam Physical Exam WDWN in NAD HEENT:  EOMI, sclera anicteric Neck:  No masses, no thyromegaly Lungs:  CTA bilaterally; normal respiratory effort Breasts - symmetric; no skin changes; no palpable masses; no palpable lymphadenopathy Left central   nipple at 9:00 - small amount of yellowish drainage expressed CV:  Regular rate and rhythm; no murmurs Abd:  +bowel sounds, soft, non-tender, no masses Ext:  Well-perfused; no edema Skin:  Warm, dry; no sign of jaundice  Data Reviewed Mammogram/ ductrom  Assessment    Left bloody nipple discharge    Plan    Left nipple duct excision.  The surgical procedure has been discussed with the patient.  Potential risks, benefits, alternative treatments, and expected outcomes have been explained.  All of the  patient's questions at this time have been answered.  The likelihood of reaching the patient's treatment goal is good.  The patient understand the proposed surgical procedure and wishes to proceed.        Lakota Schweppe K. 09/15/2011, 9:34 AM    

## 2011-10-01 ENCOUNTER — Encounter (HOSPITAL_BASED_OUTPATIENT_CLINIC_OR_DEPARTMENT_OTHER): Payer: Self-pay | Admitting: *Deleted

## 2011-10-01 ENCOUNTER — Other Ambulatory Visit: Payer: Self-pay

## 2011-10-01 ENCOUNTER — Encounter (HOSPITAL_BASED_OUTPATIENT_CLINIC_OR_DEPARTMENT_OTHER)
Admission: RE | Admit: 2011-10-01 | Discharge: 2011-10-01 | Disposition: A | Discharge status: Home / Self Care | Payer: BC Managed Care – PPO | Source: Ambulatory Visit | Attending: Surgery | Admitting: Surgery

## 2011-10-01 LAB — BASIC METABOLIC PANEL
CO2: 31 mEq/L (ref 19–32)
Calcium: 10.1 mg/dL (ref 8.4–10.5)
Creatinine, Ser: 0.77 mg/dL (ref 0.50–1.10)
GFR calc non Af Amer: >90 mL/min (ref >90)

## 2011-10-02 ENCOUNTER — Encounter (HOSPITAL_BASED_OUTPATIENT_CLINIC_OR_DEPARTMENT_OTHER)
Admission: RE | Disposition: A | Discharge status: Home / Self Care | Payer: Self-pay | Source: Ambulatory Visit | Attending: Surgery

## 2011-10-02 ENCOUNTER — Encounter (HOSPITAL_BASED_OUTPATIENT_CLINIC_OR_DEPARTMENT_OTHER): Payer: Self-pay | Admitting: Anesthesiology

## 2011-10-02 ENCOUNTER — Other Ambulatory Visit (INDEPENDENT_AMBULATORY_CARE_PROVIDER_SITE_OTHER): Payer: Self-pay | Admitting: Surgery

## 2011-10-02 ENCOUNTER — Ambulatory Visit (HOSPITAL_BASED_OUTPATIENT_CLINIC_OR_DEPARTMENT_OTHER): Payer: BC Managed Care – PPO | Admitting: Anesthesiology

## 2011-10-02 ENCOUNTER — Encounter (HOSPITAL_BASED_OUTPATIENT_CLINIC_OR_DEPARTMENT_OTHER): Payer: Self-pay | Admitting: *Deleted

## 2011-10-02 ENCOUNTER — Ambulatory Visit (HOSPITAL_BASED_OUTPATIENT_CLINIC_OR_DEPARTMENT_OTHER)
Admission: RE | Admit: 2011-10-02 | Discharge: 2011-10-02 | Disposition: A | Discharge status: Home / Self Care | Payer: BC Managed Care – PPO | Source: Ambulatory Visit | Attending: Surgery | Admitting: Surgery

## 2011-10-02 DIAGNOSIS — Z01812 Encounter for preprocedural laboratory examination: Secondary | ICD-10-CM | POA: Insufficient documentation

## 2011-10-02 DIAGNOSIS — N6452 Nipple discharge: Secondary | ICD-10-CM

## 2011-10-02 DIAGNOSIS — E785 Hyperlipidemia, unspecified: Secondary | ICD-10-CM | POA: Insufficient documentation

## 2011-10-02 DIAGNOSIS — N6459 Other signs and symptoms in breast: Secondary | ICD-10-CM

## 2011-10-02 DIAGNOSIS — D249 Benign neoplasm of unspecified breast: Secondary | ICD-10-CM | POA: Insufficient documentation

## 2011-10-02 DIAGNOSIS — Z01810 Encounter for preprocedural cardiovascular examination: Secondary | ICD-10-CM | POA: Insufficient documentation

## 2011-10-02 DIAGNOSIS — I10 Essential (primary) hypertension: Secondary | ICD-10-CM | POA: Insufficient documentation

## 2011-10-02 HISTORY — DX: Other seasonal allergic rhinitis: J30.2

## 2011-10-02 HISTORY — DX: Unspecified osteoarthritis, unspecified site: M19.90

## 2011-10-02 HISTORY — PX: BREAST SURGERY: SHX581

## 2011-10-02 HISTORY — DX: Dermatitis, unspecified: L30.9

## 2011-10-02 HISTORY — PX: BREAST DUCTAL SYSTEM EXCISION: SHX5242

## 2011-10-02 SURGERY — EXCISION DUCTAL SYSTEM BREAST
Anesthesia: General | Site: Breast | Laterality: Left | Wound class: Clean

## 2011-10-02 MED ORDER — ONDANSETRON HCL 4 MG/2ML IJ SOLN: 4.0000 mg | INTRAMUSCULAR | Status: DC | PRN

## 2011-10-02 MED ORDER — PROMETHAZINE HCL 25 MG/ML IJ SOLN: 6.2500 mg | INTRAMUSCULAR | Status: DC | PRN

## 2011-10-02 MED ORDER — MIDAZOLAM HCL 5 MG/5ML IJ SOLN
INTRAMUSCULAR | Status: DC | PRN
  Administered 2011-10-02: 2 mg via INTRAVENOUS

## 2011-10-02 MED ORDER — MORPHINE SULFATE 2 MG/ML IJ SOLN: 2.0000 mg | INTRAMUSCULAR | Status: DC | PRN

## 2011-10-02 MED ORDER — HYDROCODONE-ACETAMINOPHEN 5-325 MG PO TABS: 1.0000 | ORAL_TABLET | ORAL | Status: DC | PRN

## 2011-10-02 MED ORDER — PROPOFOL 10 MG/ML IV EMUL
INTRAVENOUS | Status: DC | PRN
  Administered 2011-10-02: 180 mg via INTRAVENOUS

## 2011-10-02 MED ORDER — HYDROCODONE-ACETAMINOPHEN 5-325 MG PO TABS: 1.0000 | ORAL_TABLET | ORAL | Status: AC | PRN

## 2011-10-02 MED ORDER — LACTATED RINGERS IV SOLN
INTRAVENOUS | Status: DC
  Administered 2011-10-02 (×2): via INTRAVENOUS

## 2011-10-02 MED ORDER — FENTANYL CITRATE 0.05 MG/ML IJ SOLN: 50.0000 ug | INTRAMUSCULAR | Status: DC | PRN

## 2011-10-02 MED ORDER — LORAZEPAM 2 MG/ML IJ SOLN: 1.0000 mg | Freq: Once | INTRAMUSCULAR | Status: DC | PRN

## 2011-10-02 MED ORDER — MIDAZOLAM HCL 2 MG/2ML IJ SOLN: 1.0000 mg | INTRAMUSCULAR | Status: DC | PRN

## 2011-10-02 MED ORDER — FENTANYL CITRATE 0.05 MG/ML IJ SOLN
INTRAMUSCULAR | Status: DC | PRN
  Administered 2011-10-02: 50 ug via INTRAVENOUS
  Administered 2011-10-02: 25 ug via INTRAVENOUS

## 2011-10-02 MED ORDER — HYDROMORPHONE HCL PF 1 MG/ML IJ SOLN
0.2500 mg | INTRAMUSCULAR | Status: DC | PRN
  Administered 2011-10-02: 0.5 mg via INTRAVENOUS

## 2011-10-02 MED ORDER — CEFAZOLIN SODIUM-DEXTROSE 2-3 GM-% IV SOLR
2.0000 g | INTRAVENOUS | Status: AC
  Administered 2011-10-02: 2 g via INTRAVENOUS

## 2011-10-02 MED ORDER — ONDANSETRON HCL 4 MG/2ML IJ SOLN
INTRAMUSCULAR | Status: DC | PRN
  Administered 2011-10-02: 4 mg via INTRAVENOUS

## 2011-10-02 MED ORDER — DEXAMETHASONE SODIUM PHOSPHATE 4 MG/ML IJ SOLN
INTRAMUSCULAR | Status: DC | PRN
  Administered 2011-10-02: 10 mg via INTRAVENOUS

## 2011-10-02 MED ORDER — BUPIVACAINE HCL (PF) 0.25 % IJ SOLN
INTRAMUSCULAR | Status: DC | PRN
  Administered 2011-10-02: 10 mL

## 2011-10-02 MED ORDER — PROMETHAZINE HCL 25 MG RE SUPP
25.0000 mg | Freq: Four times a day (QID) | RECTAL | Status: DC | PRN
  Administered 2011-10-02: 25 mg via RECTAL

## 2011-10-02 MED ORDER — LIDOCAINE HCL (CARDIAC) 20 MG/ML IV SOLN
INTRAVENOUS | Status: DC | PRN
  Administered 2011-10-02: 60 mg via INTRAVENOUS

## 2011-10-02 SURGICAL SUPPLY — 53 items
APPLICATOR COTTON TIP 6IN STRL (MISCELLANEOUS) IMPLANT
BANDAGE ELASTIC 6 VELCRO ST LF (GAUZE/BANDAGES/DRESSINGS) IMPLANT
BENZOIN TINCTURE PRP APPL 2/3 (GAUZE/BANDAGES/DRESSINGS) ×2 IMPLANT
BLADE HEX COATED 2.75 (ELECTRODE) ×2 IMPLANT
BLADE SURG 15 STRL LF DISP TIS (BLADE) ×1 IMPLANT
BLADE SURG 15 STRL SS (BLADE) ×1
CANISTER SUCTION 1200CC (MISCELLANEOUS) IMPLANT
CHLORAPREP W/TINT 26ML (MISCELLANEOUS) ×2 IMPLANT
CLIP TI MEDIUM 6 (CLIP) IMPLANT
CLIP TI WIDE RED SMALL 6 (CLIP) IMPLANT
CLOSURE STERI STRIP 1/2 X4 (GAUZE/BANDAGES/DRESSINGS) ×2 IMPLANT
CLOTH BEACON ORANGE TIMEOUT ST (SAFETY) ×2 IMPLANT
COVER MAYO STAND STRL (DRAPES) ×2 IMPLANT
COVER TABLE BACK 60X90 (DRAPES) ×2 IMPLANT
DECANTER SPIKE VIAL GLASS SM (MISCELLANEOUS) IMPLANT
DERMABOND ADVANCED (GAUZE/BANDAGES/DRESSINGS) ×1
DERMABOND ADVANCED .7 DNX12 (GAUZE/BANDAGES/DRESSINGS) ×1 IMPLANT
DEVICE DUBIN W/COMP PLATE 8390 (MISCELLANEOUS) IMPLANT
DRAPE LAPAROTOMY TRNSV 102X78 (DRAPE) ×2 IMPLANT
DRAPE UTILITY XL STRL (DRAPES) ×2 IMPLANT
DRSG EMULSION OIL 3X3 NADH (GAUZE/BANDAGES/DRESSINGS) IMPLANT
DRSG TEGADERM 4X4.75 (GAUZE/BANDAGES/DRESSINGS) ×2 IMPLANT
ELECT REM PT RETURN 9FT ADLT (ELECTROSURGICAL) ×2
ELECTRODE REM PT RTRN 9FT ADLT (ELECTROSURGICAL) ×1 IMPLANT
GLOVE BIO SURGEON STRL SZ 6.5 (GLOVE) ×2 IMPLANT
GLOVE BIO SURGEON STRL SZ7 (GLOVE) ×2 IMPLANT
GLOVE SKINSENSE NS SZ7.0 (GLOVE) ×1
GLOVE SKINSENSE STRL SZ7.0 (GLOVE) ×1 IMPLANT
GOWN PREVENTION PLUS XLARGE (GOWN DISPOSABLE) ×6 IMPLANT
NEEDLE HYPO 25X1 1.5 SAFETY (NEEDLE) ×2 IMPLANT
NS IRRIG 1000ML POUR BTL (IV SOLUTION) ×2 IMPLANT
PACK BASIN DAY SURGERY FS (CUSTOM PROCEDURE TRAY) ×2 IMPLANT
PEN SKIN MARKING BROAD TIP (MISCELLANEOUS) IMPLANT
PENCIL BUTTON HOLSTER BLD 10FT (ELECTRODE) ×2 IMPLANT
SLEEVE SCD COMPRESS KNEE MED (MISCELLANEOUS) ×2 IMPLANT
SPONGE GAUZE 2X2 8PLY STRL LF (GAUZE/BANDAGES/DRESSINGS) ×2 IMPLANT
SPONGE INTESTINAL PEANUT (DISPOSABLE) IMPLANT
SPONGE LAP 4X18 X RAY DECT (DISPOSABLE) ×2 IMPLANT
STAPLER VISISTAT 35W (STAPLE) IMPLANT
SUT MNCRL AB 4-0 PS2 18 (SUTURE) ×2 IMPLANT
SUT SILK 0 TIES 10X30 (SUTURE) IMPLANT
SUT VIC AB 3-0 CT1 27 (SUTURE)
SUT VIC AB 3-0 CT1 27XBRD (SUTURE) IMPLANT
SUT VIC AB 3-0 SH 27 (SUTURE) ×1
SUT VIC AB 3-0 SH 27X BRD (SUTURE) ×1 IMPLANT
SUT VICRYL 3-0 CR8 SH (SUTURE) IMPLANT
SYR CONTROL 10ML LL (SYRINGE) ×2 IMPLANT
SYR TB 1ML 26GX3/8 SAFETY (SYRINGE) IMPLANT
TAPE HYPAFIX 4 X10 (GAUZE/BANDAGES/DRESSINGS) IMPLANT
TOWEL OR NON WOVEN STRL DISP B (DISPOSABLE) ×2 IMPLANT
TUBE CONNECTING 20X1/4 (TUBING) IMPLANT
WATER STERILE IRR 1000ML POUR (IV SOLUTION) IMPLANT
YANKAUER SUCT BULB TIP NO VENT (SUCTIONS) IMPLANT

## 2011-10-02 NOTE — Anesthesia Procedure Notes (Signed)
Procedure Name: LMA Insertion Date/Time: 10/02/2011 8:20 AM Performed by: Jearld Shines Pre-anesthesia Checklist: Patient identified, Emergency Drugs available, Suction available and Patient being monitored Patient Re-evaluated:Patient Re-evaluated prior to inductionOxygen Delivery Method: Circle System Utilized Preoxygenation: Pre-oxygenation with 100% oxygen Intubation Type: IV induction Ventilation: Mask ventilation without difficulty LMA: LMA inserted LMA Size: 4.0 Number of attempts: 1 Airway Equipment and Method: bite block Placement Confirmation: positive ETCO2 Tube secured with: Tape Dental Injury: Teeth and Oropharynx as per pre-operative assessment

## 2011-10-02 NOTE — Anesthesia Postprocedure Evaluation (Signed)
  Anesthesia Post-op Note  Patient: Linda Christian  Procedure(s) Performed:  EXCISION DUCTAL SYSTEM BREAST - left nipple duct excision  Patient Location: PACU  Anesthesia Type: General  Level of Consciousness: awake  Airway and Oxygen Therapy: Patient Spontanous Breathing  Post-op Pain: mild  Post-op Assessment: Post-op Vital signs reviewed, Patient's Cardiovascular Status Stable, Respiratory Function Stable, Patent Airway, No signs of Nausea or vomiting and Pain level controlled  Post-op Vital Signs: stable  Complications: No apparent anesthesia complications

## 2011-10-02 NOTE — Anesthesia Preprocedure Evaluation (Signed)
Anesthesia Evaluation  Patient identified by MRN, date of birth, ID band Patient awake    Reviewed: Allergy & Precautions, H&P , NPO status , Patient's Chart, lab work & pertinent test results  Airway Mallampati: I TM Distance: >3 FB Neck ROM: Full    Dental   Pulmonary    Pulmonary exam normal       Cardiovascular hypertension,     Neuro/Psych    GI/Hepatic   Endo/Other  Diabetes mellitus-, Well Controlled, Type 2  Renal/GU      Musculoskeletal   Abdominal (+) obese,   Peds  Hematology   Anesthesia Other Findings   Reproductive/Obstetrics                           Anesthesia Physical Anesthesia Plan  ASA: II  Anesthesia Plan: General   Post-op Pain Management:    Induction: Intravenous  Airway Management Planned: LMA  Additional Equipment:   Intra-op Plan:   Post-operative Plan: Extubation in OR  Informed Consent: I have reviewed the patients History and Physical, chart, labs and discussed the procedure including the risks, benefits and alternatives for the proposed anesthesia with the patient or authorized representative who has indicated his/her understanding and acceptance.     Plan Discussed with: CRNA and Surgeon  Anesthesia Plan Comments:         Anesthesia Quick Evaluation

## 2011-10-02 NOTE — Interval H&P Note (Signed)
History and Physical Interval Note:  10/02/2011 7:23 AM  Linda Christian  has presented today for surgery, with the diagnosis of left nipple discharge  The various methods of treatment have been discussed with the patient and family. After consideration of risks, benefits and other options for treatment, the patient has consented to  Left nipple duct excision as a surgical intervention .  The patients' history has been reviewed, patient examined, no change in status, stable for surgery.  I have reviewed the patients' chart and labs.  Questions were answered to the patient's satisfaction.     Mavery Milling K.

## 2011-10-02 NOTE — H&P (View-Only) (Signed)
Patient ID: Linda Christian, female   DOB: [birth year], 51 y.o.   MRN: [id number]  Chief Complaint  Patient presents with  . Other    est pt new prob- eval nipple discharge    HPI Linda Christian is a 51 y.o. female.  Referred by Dr. Sherian Rein for bloody nipple discharge HPI A 51 year old female in good health who presents with approximately 7 months of clear drainage from her left nipple. Over the last couple of weeks this has become bloody in appearance. Her last mammogram was last year and was read as normal. She underwent a diagnostic left mammogram on December 18 which showed possible asymmetry in the retroareolar left breast. She underwent a left breast ductogram on the same day which showed a lobulated 1 cm intraductal mass in the medial lower part of the retroareolar region. She presents now for discussion for excision. Past Medical History  Diagnosis Date  . Allergy     allergic rhinitis  . Hypertension   . Hyperlipidemia   . Glucose intolerance (impaired glucose tolerance)   . History of colon polyps 11/2006    hyperplastic  . Menorrhagia   . Routine general medical examination at a health care facility   . Nipple discharge   . Bloody discharge from nipple - central left nipple at 9:00 09/15/2011    Past Surgical History  Procedure Date  . Polypectomy   . Hemorrhoid surgery     Family History  Problem Relation Age of Onset  . Hypertension Mother   . Diabetes Mother     type II  . Hypertension Father   . Diabetes Father     type II  . Cancer Neg Hx     negative for colon cancer    Social History History  Substance Use Topics  . Smoking status: Former Smoker    Quit date: 08/31/1993  . Smokeless tobacco: Never Used  . Alcohol Use: No    No Known Allergies  Current Outpatient Prescriptions  Medication Sig Dispense Refill  . fluticasone (FLONASE) 50 MCG/ACT nasal spray 2 sprays by Nasal route daily.  16 g  5  . hydrochlorothiazide (HYDRODIURIL) 25 MG  tablet Take 1 tablet (25 mg total) by mouth daily.  90 tablet  2    Review of Systems Review of Systems  Constitutional: Negative for fever, chills and unexpected weight change.  HENT: Negative for hearing loss, congestion, sore throat, trouble swallowing and voice change.   Eyes: Negative for visual disturbance.  Respiratory: Negative for cough and wheezing.   Cardiovascular: Negative for chest pain, palpitations and leg swelling.  Gastrointestinal: Negative for nausea, vomiting, abdominal pain, diarrhea, constipation, blood in stool, abdominal distention and anal bleeding.  Genitourinary: Negative for hematuria, vaginal bleeding and difficulty urinating.  Musculoskeletal: Negative for arthralgias.  Skin: Negative for rash and wound.  Neurological: Negative for seizures, syncope and headaches.  Hematological: Negative for adenopathy. Does not bruise/bleed easily.  Psychiatric/Behavioral: Negative for confusion.  Menarche - age 68 First pregnancy age33 Breastfeed - no OCP - 2 years LMP - last week FH - negative for breast cancer  Filed Vitals:   09/15/11 0903  BP: 126/88  Pulse: 80  Temp: 98 F (36.7 C)  Resp: 16    Physical Exam Physical Exam WDWN in NAD HEENT:  EOMI, sclera anicteric Neck:  No masses, no thyromegaly Lungs:  CTA bilaterally; normal respiratory effort Breasts - symmetric; no skin changes; no palpable masses; no palpable lymphadenopathy Left central  nipple at 9:00 - small amount of yellowish drainage expressed CV:  Regular rate and rhythm; no murmurs Abd:  +bowel sounds, soft, non-tender, no masses Ext:  Well-perfused; no edema Skin:  Warm, dry; no sign of jaundice  Data Reviewed Mammogram/ ductrom  Assessment    Left bloody nipple discharge    Plan    Left nipple duct excision.  The surgical procedure has been discussed with the patient.  Potential risks, benefits, alternative treatments, and expected outcomes have been explained.  All of the  patient's questions at this time have been answered.  The likelihood of reaching the patient's treatment goal is good.  The patient understand the proposed surgical procedure and wishes to proceed.        Ellsworth Waldschmidt K. 09/15/2011, 9:34 AM

## 2011-10-02 NOTE — Transfer of Care (Cosign Needed)
Immediate Anesthesia Transfer of Care Note  Patient: Linda Christian  Procedure(s) Performed:  EXCISION DUCTAL SYSTEM BREAST - left nipple duct excision  Patient Location: PACU  Anesthesia Type: General  Level of Consciousness: sedated  Airway & Oxygen Therapy: Patient Spontanous Breathing  Post-op Assessment: Report given to PACU RN  Post vital signs: Reviewed and stable  Complications: No apparent anesthesia complications

## 2011-10-02 NOTE — Op Note (Signed)
Pre-Op Dx - Bloody nipple discharge - left breast Post-op Dx - same Procedure - left nipple duct excision [name] K. Anesthesia - Gen - LMA Indications - 51 yo female with bloody nipple discharge from the left nipple.  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  She presents for excision.  Procedure  - The patient was brought to the operating room and placed in a supine position on the operating room table.  After an adequate level of anesthesia was obtained, the left breast was prepped with chloraprep and draped in sterile fashion. A timeout was taken.  I was able to express some drainage from a duct opening in the central left nipple at 9:00.  This corresponds to the previous site of drainage.  I cannulated this duct with a lacrimal duct probe.  We then anesthetized around the areola with 0.25% Marcaine.  I made a circumareolar incision on the medial side of the areola.  We dissected laterally behind the nipple until we were able to visualize the probe.  The duct was amputated from the posterior surface of the nipple.  I then took a cone of breast tissue about 4 cm deep and 3 cm in diameter with the superficial portion of the duct at the apex.  The specimen was oriented with a paint kit and set for pathologic examination.  The wound was irrigated and inspected for hemostasis.  We closed the wound with a deep layer of 3-0 Vicryl and subcuticular layer of 4-0 Monocryl.  Steri-strips and a clean dressing were applied.  The patient was extubated and brought to the recovery room in stable condition.  All counts were correct.  Linda Christian. Corliss Skains, MD, [facility]  10/02/2011 9:14 AM

## 2011-10-05 NOTE — Progress Notes (Signed)
Quick Note:  Please call the patient and let them know that their labs/ path are normal. 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 ______

## 2011-10-06 ENCOUNTER — Encounter (INDEPENDENT_AMBULATORY_CARE_PROVIDER_SITE_OTHER): Payer: Self-pay | Admitting: Surgery

## 2011-10-07 ENCOUNTER — Encounter (HOSPITAL_BASED_OUTPATIENT_CLINIC_OR_DEPARTMENT_OTHER): Payer: Self-pay | Admitting: Surgery

## 2011-10-20 ENCOUNTER — Ambulatory Visit (INDEPENDENT_AMBULATORY_CARE_PROVIDER_SITE_OTHER): Payer: BC Managed Care – PPO | Admitting: Surgery

## 2011-10-20 ENCOUNTER — Encounter (INDEPENDENT_AMBULATORY_CARE_PROVIDER_SITE_OTHER): Payer: Self-pay | Admitting: Surgery

## 2011-10-20 VITALS — BP 124/80 | HR 84 | Temp 97.4°F | Resp 16 | Ht 65.0 in | Wt 188.4 lb

## 2011-10-20 DIAGNOSIS — N6452 Nipple discharge: Secondary | ICD-10-CM

## 2011-10-20 DIAGNOSIS — N6459 Other signs and symptoms in breast: Secondary | ICD-10-CM

## 2011-10-20 NOTE — Progress Notes (Signed)
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  The patient is doing quite well.  Minimal pain.  Filed Vitals:   10/20/11 1440  BP: 124/80  Pulse: 84  Temp: 97.4 F (36.3 C)  Resp: 16   The incision is very well-healed and is barely visible at the edge of the areola.  No sign of infection.    Intraductal papilloma s/p excision Resume routine mammogram screening. Follow-up PRN.  Wilmon Arms. Corliss Skains, MD, [facility]  10/20/2011 4:47 PM

## 2011-10-20 NOTE — Patient Instructions (Signed)
Annual mammogram in 6 months. Follow-up PRN

## 2011-10-26 ENCOUNTER — Encounter: Payer: Self-pay | Admitting: Family Medicine

## 2011-10-26 ENCOUNTER — Ambulatory Visit (INDEPENDENT_AMBULATORY_CARE_PROVIDER_SITE_OTHER): Payer: BC Managed Care – PPO | Admitting: Family Medicine

## 2011-10-26 VITALS — BP 140/81 | HR 74 | Temp 97.4°F | Ht 66.0 in | Wt 185.0 lb

## 2011-10-26 DIAGNOSIS — M542 Cervicalgia: Secondary | ICD-10-CM
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
## 2011-10-26 NOTE — Patient Instructions (Signed)
You have already done the conservative protocol (only with the exception of prednisone which is unlikely to help with over 16 months of pain) for cervical radiculopathy or strain. I believe you have an irritated nerve in the lower cervical region of your spine based on your history and exam. Get me a copy of the x-rays you had with the chiropractor and drop them off up front. The sooner you do this the sooner we can go ahead with an MRI to further assess. Then your next step would be one of four things depending on those results: 1. Injection into epidural space where nerve comes out. 2. Injection into facet joint of cervical spine 3. Trigger point injections in our office 4. Surgery Otherwise you have already done physical therapy and tried the other three medications we typically prescribe for this. I think it's time to move forward with advanced imaging (MRI).

## 2011-10-26 NOTE — Assessment & Plan Note (Signed)
exam noted for spasms right paraspinal region near junction of cervical and thoracic spine.  Has not improved with long course of conservative care including PT, prednisone, other nsaids, muscle relaxants, pain medicine, chiropractic care, heat/ice.  This is likely due to irritation of lower cervical nerve root on right side leading to spasms.  Advised to get a copy of her x-rays for me to review - will then likely move forward with MRI to further assess then consider ESIs, facet injections, or trigger point injections based on findings.  See instructions for further.

## 2011-11-19 ENCOUNTER — Encounter: Payer: Self-pay | Admitting: Internal Medicine

## 2011-11-19 ENCOUNTER — Telehealth: Payer: Self-pay | Admitting: *Deleted

## 2011-11-19 ENCOUNTER — Ambulatory Visit: Payer: BC Managed Care – PPO | Admitting: Internal Medicine

## 2011-11-19 ENCOUNTER — Ambulatory Visit (INDEPENDENT_AMBULATORY_CARE_PROVIDER_SITE_OTHER): Payer: BC Managed Care – PPO | Admitting: Internal Medicine

## 2011-11-19 VITALS — BP 126/70 | HR 88 | Temp 98.0°F | Resp 16 | Wt 180.0 lb

## 2011-11-19 DIAGNOSIS — N920 Excessive and frequent menstruation with regular cycle: Secondary | ICD-10-CM | POA: Insufficient documentation

## 2011-11-19 DIAGNOSIS — D649 Anemia, unspecified: Secondary | ICD-10-CM

## 2011-11-19 LAB — CBC WITH DIFFERENTIAL/PLATELET
Eosinophils Relative: 4 % (ref 0–5)
Lymphocytes Relative: 28 % (ref 12–46)
Lymphs Abs: 2.7 10*3/uL (ref 0.7–4.0)
MCV: 90 fL (ref 78.0–100.0)
Neutrophils Relative %: 62 % (ref 43–77)
Platelets: 257 10*3/uL (ref 150–400)
RBC: 4.02 MIL/uL (ref 3.87–5.11)
WBC: 9.5 10*3/uL (ref 4.0–10.5)

## 2011-11-19 LAB — FERRITIN: Ferritin: 45 ng/mL (ref 10–291)

## 2011-11-19 NOTE — Assessment & Plan Note (Signed)
Pt interested in alternative gyn referral. Order placed

## 2011-11-19 NOTE — Assessment & Plan Note (Signed)
Obtain cbc and ferritin. 

## 2011-11-19 NOTE — Telephone Encounter (Signed)
Call placed to patient at [phone], she was informed per Dr Rodena Medin instructions and has verbalized understanding.

## 2011-11-19 NOTE — Telephone Encounter (Signed)
Patient called and left voice message requesting the results from her lab tests from this morning.

## 2011-11-19 NOTE — Progress Notes (Signed)
  Subjective:    Patient ID: Linda Christian, female    DOB: May 10, 1961, 51 y.o.   MRN: [id number]  HPI Pt presents to clinic for evaluation of anemia. Notes abnormal menstration and menorrhagia since 10/18/2010. January had period of longer duration but typical flow. Period has not ceased since 2/18 and at peak is using 5-7 super tampons. Has associated clots. Seeing gyn who performed reportedly nl pelvic US and planned her on unspecified ocp without improvement. Has +fatigue. Is taking otc iron bid. No hematemesis, hematochezia or hematuria.  . Past Medical History  Diagnosis Date  . Nipple discharge 09/2011    left  . Hypertension     under control; has been on med. x 8-9 yrs.  . Seasonal allergies   . 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 Eczema     perineal area  . Irregular periods/menstrual cycles    Past Surgical History  Procedure Date  . Hemorrhoid surgery 04/08/2007  . Breast surgery 10/02/2011  . Breast ductal system excision 10/02/2011    Procedure: EXCISION DUCTAL SYSTEM BREAST;  Surgeon: Wilmon Arms. Corliss Skains, MD;  Location: [facility];  Service: General;  Laterality: Left;  left nipple duct excision    reports that she has quit smoking. She has never used smokeless tobacco. She reports that she does not drink alcohol or use illicit drugs. family history includes Anesthesia problems in her father; Diabetes in her father and mother; and Hypertension in her father and mother.  There is no history of Cancer, and Heart attack, and Hyperlipidemia, and Sudden death, . Allergies  Allergen Reactions  . Shellfish Allergy Shortness Of Breath    CLOSES THROAT     Review of Systems see hpi     Objective:   Physical Exam  Nursing note and vitals reviewed. Constitutional: She appears well-developed and well-nourished. No distress.  HENT:  Head: Normocephalic and atraumatic.  Neurological: She is alert.  Skin: She is not  diaphoretic.  Psychiatric: She has a normal mood and affect.          Assessment & Plan:

## 2011-11-19 NOTE — Telephone Encounter (Signed)
hgb was 11.7. Only slightly low and improved from last check (hospital). Ferritin normal but would still continue iron.

## 2011-11-21 ENCOUNTER — Encounter (HOSPITAL_COMMUNITY): Payer: Self-pay | Admitting: Emergency Medicine

## 2011-11-21 ENCOUNTER — Emergency Department (HOSPITAL_COMMUNITY)
Admission: EM | Admit: 2011-11-21 | Discharge: 2011-11-21 | Disposition: A | Discharge status: Home / Self Care | Payer: BC Managed Care – PPO | Attending: Emergency Medicine | Admitting: Emergency Medicine

## 2011-11-21 DIAGNOSIS — N939 Abnormal uterine and vaginal bleeding, unspecified: Secondary | ICD-10-CM

## 2011-11-21 DIAGNOSIS — I10 Essential (primary) hypertension: Secondary | ICD-10-CM | POA: Insufficient documentation

## 2011-11-21 DIAGNOSIS — N898 Other specified noninflammatory disorders of vagina: Secondary | ICD-10-CM | POA: Insufficient documentation

## 2011-11-21 DIAGNOSIS — Z8739 Personal history of other diseases of the musculoskeletal system and connective tissue: Secondary | ICD-10-CM | POA: Insufficient documentation

## 2011-11-21 LAB — URINALYSIS, ROUTINE W REFLEX MICROSCOPIC
Bilirubin Urine: NEGATIVE
Glucose, UA: NEGATIVE mg/dL
Nitrite: NEGATIVE
Protein, ur: 30 mg/dL — AB
Specific Gravity, Urine: 1.02 (ref 1.005–1.030)
Urobilinogen, UA: 0.2 mg/dL (ref 0.0–1.0)
pH: 7.5 (ref 5.0–8.0)

## 2011-11-21 LAB — WET PREP, GENITAL
Clue Cells Wet Prep HPF POC: NONE SEEN
Trich, Wet Prep: NONE SEEN
Yeast Wet Prep HPF POC: NONE SEEN

## 2011-11-21 LAB — POCT I-STAT, CHEM 8
Calcium, Ion: 1.2 mmol/L (ref 1.12–1.32)
Chloride: 101 mEq/L (ref 96–112)
HCT: 32 % — ABNORMAL LOW (ref 36.0–46.0)
Hemoglobin: 10.9 g/dL — ABNORMAL LOW (ref 12.0–15.0)
Potassium: 3.4 mEq/L — ABNORMAL LOW (ref 3.5–5.1)

## 2011-11-21 LAB — URINE MICROSCOPIC-ADD ON

## 2011-11-21 MED ORDER — IBUPROFEN 800 MG PO TABS
ORAL_TABLET | ORAL | Status: AC
  Filled 2011-11-21: qty 1

## 2011-11-21 MED ORDER — MEDROXYPROGESTERONE ACETATE 5 MG PO TABS: ORAL_TABLET | ORAL | Status: DC

## 2011-11-21 MED ORDER — IBUPROFEN 800 MG PO TABS
800.0000 mg | ORAL_TABLET | Freq: Once | ORAL | Status: AC
  Administered 2011-11-21: 800 mg via ORAL

## 2011-11-21 MED ORDER — MEDROXYPROGESTERONE ACETATE 10 MG PO TABS
10.0000 mg | ORAL_TABLET | Freq: Every day | ORAL | Status: DC
  Administered 2011-11-21: 10 mg via ORAL
  Filled 2011-11-21 (×2): qty 1

## 2011-11-21 NOTE — ED Notes (Signed)
Per pt, bleeding since Fed, went to OBGYN and was placed on birth control pills-made symptoms worse-increased cramping, clots, bleeding-worked up by PCP

## 2011-11-21 NOTE — ED Provider Notes (Signed)
Medical screening examination/treatment/procedure(s) were performed by non-physician practitioner and as supervising physician I was immediately available for consultation/collaboration.  Doug Sou, MD 11/21/11 [phone]

## 2011-11-21 NOTE — Discharge Instructions (Signed)
Follow up with the GYN as soon as possible. Return here as needed.

## 2011-11-21 NOTE — ED Provider Notes (Signed)
History     CSN: [id number]  Arrival date & time 11/21/11  4098   First MD Initiated Contact with Patient 11/21/11 [phone]      Chief Complaint  Patient presents with  . Vaginal Bleeding    (Consider location/radiation/quality/duration/timing/severity/associated sxs/prior treatment) HPI Linda Christian is a 51 yo female who presents today with menstrual bleeding which has been occurring for 35 days.  Pt states that she has had regular periods, lasting a week, once a month until January, where she had a period which lasted 2 weeks, then spontaneously stopped.  She then started having her period again, which is still ongoing.  She went to see her gynecologist, who placed her on Loestrin Fe birth control.  She stated the bleeding worsened and more clots became visible and the cramping increased.  She then contacted her PCP who checked for anemia, where her Hgb was 11.7, and she was advised to take oral iron supplements.  Her PCP also recommended stopping the birth control pills and referred to another GYN for f/u.  She states that she is bleeding too much to wait for the next appointment and wanted to be seen here.  She denies any fever, chills, SOB, chest pain, vaginal discharge, N/V/D.  Past Medical History  Diagnosis Date  . Nipple discharge 09/2011    left  . Hypertension     under control; has been on med. x 8-9 yrs.  . Seasonal allergies   . 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 Eczema     perineal area  . Irregular periods/menstrual cycles     Past Surgical History  Procedure Date  . Hemorrhoid surgery 04/08/2007  . Breast surgery 10/02/2011  . Breast ductal system excision 10/02/2011    Procedure: EXCISION DUCTAL SYSTEM BREAST;  Surgeon: Wilmon Arms. Corliss Skains, MD;  Location: [facility];  Service: General;  Laterality: Left;  left nipple duct excision    Family History  Problem Relation Age of Onset  . Hypertension Mother   . Diabetes  Mother     type II  . Hypertension Father   . Diabetes Father     type II  . Anesthesia problems Father     renal function slow after anesthesia  . Cancer Neg Hx     negative for colon cancer  . Heart attack Neg Hx   . Hyperlipidemia Neg Hx   . Sudden death Neg Hx     History  Substance Use Topics  . Smoking status: Former Games developer  . Smokeless tobacco: Never Used   Comment: quit smoking 1982  . Alcohol Use: No    OB History    Grav Para Term Preterm Abortions TAB SAB Ect Mult Living                  Review of Systems All pertinent positives and negatives reviewed in the history of present illness  Allergies  Shellfish allergy  Home Medications   Current Outpatient Rx  Name Route Sig Dispense Refill  . IRON 240 (27 FE) MG PO TABS Oral Take 200 mg by mouth every other day.    . OMEGA-3 FATTY ACIDS 1000 MG PO CAPS Oral Take 2 g by mouth daily.    Marland Kitchen FLUTICASONE PROPIONATE 50 MCG/ACT NA SUSP Nasal 2 sprays by Nasal route daily. 16 g 5  . HYDROCHLOROTHIAZIDE 25 MG PO TABS Oral Take 1 tablet (25 mg total) by mouth daily.  90 tablet 2  . ONE-DAILY MULTI VITAMINS PO TABS Oral Take 1 tablet by mouth daily.    Kathrynn Running ESTRAD-FE BIPHAS 1 MG-10 MCG / 10 MCG PO TABS Oral Take 1 tablet by mouth daily.      BP 113/65  Pulse 93  Temp 98.2 F (36.8 C)  Resp 18  LMP 10/19/2011  Physical Exam  Constitutional: She is oriented to person, place, and time. She appears well-developed and well-nourished. No distress.  Cardiovascular: Normal rate, regular rhythm, normal heart sounds and intact distal pulses.   No murmur heard. Pulmonary/Chest: Effort normal and breath sounds normal. No respiratory distress. She has no wheezes.  Abdominal: Soft. Bowel sounds are normal. She exhibits no distension. There is tenderness (over suprapubic, RLQ and LLQ areas).  Neurological: She is alert and oriented to person, place, and time.  Skin: Skin is warm. She is not diaphoretic. No erythema.      ED Course  Procedures (including critical care time)   Labs Reviewed  URINALYSIS, ROUTINE W REFLEX MICROSCOPIC   No results found.  Pt seen and assessed.  Will order labs and perform pelvic.   The patient will be treated for this abnormal vaginal bleeding with Provera for 5 days.  She will be referred back to her GYN doctor for further evaluation. She is advised to return here as needed.  MDM  MDM Reviewed: nursing note, previous chart and vitals Reviewed previous: labs Interpretation: labs            Carlyle Dolly, PA-C 11/21/11 1159

## 2011-11-23 LAB — GC/CHLAMYDIA PROBE AMP, GENITAL
Chlamydia, DNA Probe: NEGATIVE
GC Probe Amp, Genital: NEGATIVE

## 2012-02-11 ENCOUNTER — Encounter: Payer: Self-pay | Admitting: Internal Medicine

## 2012-02-11 ENCOUNTER — Ambulatory Visit (INDEPENDENT_AMBULATORY_CARE_PROVIDER_SITE_OTHER): Payer: BC Managed Care – PPO | Admitting: Internal Medicine

## 2012-02-11 VITALS — BP 124/80 | HR 110 | Temp 98.4°F | Resp 18 | Ht 65.5 in | Wt 183.0 lb

## 2012-02-11 DIAGNOSIS — J069 Acute upper respiratory infection, unspecified: Secondary | ICD-10-CM | POA: Insufficient documentation

## 2012-02-11 MED ORDER — DOXYCYCLINE HYCLATE 100 MG PO TABS: 100.0000 mg | ORAL_TABLET | Freq: Two times a day (BID) | ORAL | Status: AC

## 2012-02-11 NOTE — Assessment & Plan Note (Signed)
Suspect viral etiology. Discussed otc sx tx prn. Given printed abx to hold. Begin abx if sx's do not improve after total duration of 8-10 days. Followup if no improvement or worsening.

## 2012-02-11 NOTE — Progress Notes (Signed)
  Subjective:    Patient ID: Linda Christian, female    DOB: [birth year], 51 y.o.   MRN: [id number]  HPI Pt presents to clinic for evaluation of cough. Notes 3d h/o head congestion, np cough, fever. Recently tx'ed at [facility] for presumptive strep with neg rapid. Completed 10 d course of amox. Sx's resolved prior to development of recent sx's. No other alleviating or exacerbating factors.   Past Medical History  Diagnosis Date  . Nipple discharge 09/2011    left  . Hypertension     under control; has been on med. x 8-9 yrs.  . Seasonal allergies   . 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 Eczema     perineal area  . Irregular periods/menstrual cycles    Past Surgical History  Procedure Date  . Hemorrhoid surgery 04/08/2007  . Breast surgery 10/02/2011  . Breast ductal system excision 10/02/2011    Procedure: EXCISION DUCTAL SYSTEM BREAST;  Surgeon: Wilmon Arms. Corliss Skains, MD;  Location: [facility];  Service: General;  Laterality: Left;  left nipple duct excision    reports that she has quit smoking. She has never used smokeless tobacco. She reports that she does not drink alcohol or use illicit drugs. family history includes Anesthesia problems in her father; Diabetes in her father and mother; and Hypertension in her father and mother.  There is no history of Cancer, and Heart attack, and Hyperlipidemia, and Sudden death, . Allergies  Allergen Reactions  . Shellfish Allergy Shortness Of Breath    CLOSES THROAT     Review of Systems see hpi     Objective:   Physical Exam  Nursing note and vitals reviewed. Constitutional: She appears well-developed and well-nourished. No distress.  HENT:  Head: Normocephalic and atraumatic.  Right Ear: Tympanic membrane, external ear and ear canal normal.  Left Ear: Tympanic membrane, external ear and ear canal normal.  Nose: Nose normal.  Mouth/Throat: Oropharynx is clear and moist. No oropharyngeal exudate.   Eyes: Conjunctivae are normal. No scleral icterus.  Neck: Neck supple.  Cardiovascular: Normal rate, regular rhythm and normal heart sounds.   Pulmonary/Chest: Effort normal and breath sounds normal. No respiratory distress. She has no wheezes. She has no rales.  Lymphadenopathy:    She has no cervical adenopathy.  Neurological: She is alert.  Skin: Skin is warm and dry. She is not diaphoretic.  Psychiatric: She has a normal mood and affect.          Assessment & Plan:

## 2012-04-25 ENCOUNTER — Telehealth: Payer: Self-pay | Admitting: Internal Medicine

## 2012-04-25 DIAGNOSIS — I10 Essential (primary) hypertension: Secondary | ICD-10-CM

## 2012-04-25 MED ORDER — HYDROCHLOROTHIAZIDE 25 MG PO TABS: 25.0000 mg | ORAL_TABLET | Freq: Every day | ORAL | Status: DC

## 2012-04-25 NOTE — Telephone Encounter (Signed)
Refill-hydrochlorot 25mg  tab. Take one by mouth daily. qyt 90 days supply.

## 2012-04-25 NOTE — Telephone Encounter (Signed)
Rx done/SLS 

## 2012-06-07 ENCOUNTER — Telehealth: Payer: Self-pay | Admitting: Internal Medicine

## 2012-06-07 NOTE — Telephone Encounter (Signed)
Patient has an cpe appointment for 06/21/12. She would like to come in one week early for lab work. She will be going to [facility] lab. Please order labs

## 2012-06-08 ENCOUNTER — Other Ambulatory Visit: Payer: Self-pay

## 2012-06-08 DIAGNOSIS — Z0000 Encounter for general adult medical examination without abnormal findings: Secondary | ICD-10-CM

## 2012-06-08 NOTE — Telephone Encounter (Signed)
Cbc, chem7, lipid, lft, a1c, tsh, ua with reflex micro v70.0

## 2012-06-08 NOTE — Telephone Encounter (Signed)
Labs have been ordered

## 2012-06-16 LAB — CBC WITH DIFFERENTIAL/PLATELET
Basophils Absolute: 0 10*3/uL (ref 0.0–0.1)
HCT: 37.2 % (ref 36.0–46.0)
Hemoglobin: 12.5 g/dL (ref 12.0–15.0)
Lymphocytes Relative: 36 % (ref 12–46)
Monocytes Absolute: 0.5 10*3/uL (ref 0.1–1.0)
Neutro Abs: 3.3 10*3/uL (ref 1.7–7.7)
Neutrophils Relative %: 51 % (ref 43–77)
RDW: 13.4 % (ref 11.5–15.5)
WBC: 6.6 10*3/uL (ref 4.0–10.5)

## 2012-06-16 LAB — LIPID PANEL
HDL: 66 mg/dL (ref >39)
LDL Cholesterol: 123 mg/dL — ABNORMAL HIGH (ref 0–99)
Total CHOL/HDL Ratio: 3.1 Ratio
Triglycerides: 74 mg/dL (ref <150)
VLDL: 15 mg/dL (ref 0–40)

## 2012-06-16 LAB — BASIC METABOLIC PANEL
BUN: 10 mg/dL (ref 6–23)
Chloride: 99 mEq/L (ref 96–112)
Potassium: 4 mEq/L (ref 3.5–5.3)
Sodium: 139 mEq/L (ref 135–145)

## 2012-06-16 LAB — HEPATIC FUNCTION PANEL
ALT: 19 U/L (ref 0–35)
Albumin: 4 g/dL (ref 3.5–5.2)
Indirect Bilirubin: 0.4 mg/dL (ref 0.0–0.9)
Total Protein: 7.6 g/dL (ref 6.0–8.3)

## 2012-06-16 LAB — TSH: TSH: 0.534 u[IU]/mL (ref 0.350–4.500)

## 2012-06-16 LAB — HEMOGLOBIN A1C
Hgb A1c MFr Bld: 6.1 % — ABNORMAL HIGH (ref <5.7)
Mean Plasma Glucose: 128 mg/dL — ABNORMAL HIGH (ref <117)

## 2012-06-16 NOTE — Progress Notes (Signed)
Pt presented to the lab, future orders released. 

## 2012-06-16 NOTE — Addendum Note (Signed)
Addended by: Mervin Kung A on: 06/16/2012 08:53 AM   Modules accepted: Orders

## 2012-06-17 LAB — URINALYSIS, ROUTINE W REFLEX MICROSCOPIC
Bilirubin Urine: NEGATIVE
Hgb urine dipstick: NEGATIVE
Ketones, ur: NEGATIVE mg/dL
Nitrite: NEGATIVE
Protein, ur: NEGATIVE mg/dL
Urobilinogen, UA: 0.2 mg/dL (ref 0.0–1.0)

## 2012-06-21 ENCOUNTER — Ambulatory Visit (INDEPENDENT_AMBULATORY_CARE_PROVIDER_SITE_OTHER): Payer: BC Managed Care – PPO | Admitting: Internal Medicine

## 2012-06-21 ENCOUNTER — Encounter: Payer: Self-pay | Admitting: Internal Medicine

## 2012-06-21 VITALS — BP 118/82 | HR 88 | Temp 98.3°F | Resp 16 | Ht 65.5 in | Wt 175.8 lb

## 2012-06-21 DIAGNOSIS — M542 Cervicalgia: Secondary | ICD-10-CM

## 2012-06-21 DIAGNOSIS — Z0000 Encounter for general adult medical examination without abnormal findings: Secondary | ICD-10-CM

## 2012-06-21 DIAGNOSIS — G8929 Other chronic pain: Secondary | ICD-10-CM

## 2012-06-21 NOTE — Patient Instructions (Signed)
Please schedule fasting labs prior to next visit Chem7, a1c- hyperglycemia and lipid-272.4 

## 2012-06-25 DIAGNOSIS — Z0000 Encounter for general adult medical examination without abnormal findings: Secondary | ICD-10-CM | POA: Insufficient documentation

## 2012-06-25 NOTE — Assessment & Plan Note (Signed)
Obtain cpe labs. Pap smears per gyn. Has already received influenza vaccine

## 2012-06-25 NOTE — Assessment & Plan Note (Signed)
Schedule PT

## 2012-06-25 NOTE — Progress Notes (Signed)
  Subjective:    Patient ID: Linda Christian, female    DOB: 20-Aug-1961, 51 y.o.   MRN: [id number]  HPI patient presents to clinic for annual exam. No tenderness in neck and shoulder pain this progressed over the past several years. Located right greater than left posterior neck predominantly. Ibuprofen helps. No radicular symptoms, paresthesias or weakness of the arms. Has been treated by a chiropractor without significant improvement. Women's care provided by GYN and up-to-date.  Past Medical History  Diagnosis Date  . Nipple discharge 09/2011    left  . Hypertension     under control; has been on med. x 8-9 yrs.  . Seasonal allergies   . 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 Eczema     perineal area  . Irregular periods/menstrual cycles    Past Surgical History  Procedure Date  . Hemorrhoid surgery 04/08/2007  . Breast surgery 10/02/2011  . Breast ductal system excision 10/02/2011    Procedure: EXCISION DUCTAL SYSTEM BREAST;  Surgeon: Wilmon Arms. Corliss Skains, MD;  Location: [facility];  Service: General;  Laterality: Left;  left nipple duct excision    reports that she has quit smoking. She has never used smokeless tobacco. She reports that she does not drink alcohol or use illicit drugs. family history includes Anesthesia problems in her father; Diabetes in her father and mother; and Hypertension in her father and mother.  There is no history of Cancer, and Heart attack, and Hyperlipidemia, and Sudden death, . Allergies  Allergen Reactions  . Shellfish Allergy Shortness Of Breath    CLOSES THROAT     Review of Systems see hpi     Objective:   Physical Exam  Nursing note and vitals reviewed. Constitutional: She appears well-developed and well-nourished. No distress.  HENT:  Head: Normocephalic and atraumatic.  Right Ear: Tympanic membrane, external ear and ear canal normal.  Left Ear: Tympanic membrane, external ear and ear canal  normal.  Nose: Nose normal.  Mouth/Throat: Oropharynx is clear and moist. No oropharyngeal exudate.  Eyes: Conjunctivae normal and EOM are normal. Pupils are equal, round, and reactive to light. No scleral icterus.  Neck: Neck supple. Carotid bruit is not present. No thyromegaly present.  Cardiovascular: Normal rate, regular rhythm and normal heart sounds.  Exam reveals no gallop and no friction rub.   No murmur heard. Pulmonary/Chest: Effort normal and breath sounds normal. No respiratory distress. She has no wheezes. She has no rales.  Abdominal: Soft. Bowel sounds are normal. She exhibits no distension and no mass. There is no tenderness. There is no rebound and no guarding.  Lymphadenopathy:    She has no cervical adenopathy.  Neurological: She is alert.  Skin: Skin is warm and dry. She is not diaphoretic.  Psychiatric: She has a normal mood and affect.          Assessment & Plan:

## 2012-07-08 ENCOUNTER — Ambulatory Visit: Payer: BC Managed Care – PPO | Attending: Internal Medicine | Admitting: Rehabilitation

## 2012-07-08 DIAGNOSIS — M546 Pain in thoracic spine: Secondary | ICD-10-CM | POA: Insufficient documentation

## 2012-07-08 DIAGNOSIS — M542 Cervicalgia: Secondary | ICD-10-CM | POA: Insufficient documentation

## 2012-07-08 DIAGNOSIS — IMO0001 Reserved for inherently not codable concepts without codable children: Secondary | ICD-10-CM | POA: Insufficient documentation

## 2012-07-13 ENCOUNTER — Ambulatory Visit: Payer: BC Managed Care – PPO | Admitting: Rehabilitation

## 2012-07-15 ENCOUNTER — Ambulatory Visit: Payer: BC Managed Care – PPO | Admitting: Rehabilitation

## 2012-07-20 ENCOUNTER — Ambulatory Visit: Payer: BC Managed Care – PPO | Admitting: Rehabilitation

## 2012-07-22 ENCOUNTER — Ambulatory Visit: Payer: BC Managed Care – PPO | Admitting: Rehabilitation

## 2012-07-25 ENCOUNTER — Ambulatory Visit: Payer: BC Managed Care – PPO | Admitting: Rehabilitation

## 2012-07-27 ENCOUNTER — Ambulatory Visit: Payer: BC Managed Care – PPO | Admitting: Rehabilitation

## 2012-08-02 ENCOUNTER — Ambulatory Visit: Payer: BC Managed Care – PPO | Admitting: Internal Medicine

## 2012-08-03 ENCOUNTER — Ambulatory Visit: Payer: BC Managed Care – PPO | Admitting: Rehabilitation

## 2012-08-05 ENCOUNTER — Ambulatory Visit: Payer: BC Managed Care – PPO | Admitting: Rehabilitation

## 2012-08-10 ENCOUNTER — Ambulatory Visit: Payer: BC Managed Care – PPO | Admitting: Rehabilitation

## 2012-08-12 ENCOUNTER — Ambulatory Visit: Payer: BC Managed Care – PPO | Admitting: Rehabilitation

## 2012-08-19 ENCOUNTER — Telehealth: Payer: Self-pay | Admitting: Internal Medicine

## 2012-08-19 NOTE — Telephone Encounter (Signed)
Last Rx 08.26.13 #[id number]-Too soon to refill at this time; [facility] with contact name & number to inform pt that request for available refill via [employer] is what that need to do/SLS

## 2012-08-19 NOTE — Telephone Encounter (Signed)
Refill- hydrochlorothiazide 25mg  tablet. Qty 90 days supply

## 2012-12-01 ENCOUNTER — Other Ambulatory Visit: Payer: BC Managed Care – PPO

## 2012-12-02 ENCOUNTER — Other Ambulatory Visit: Payer: Self-pay | Admitting: *Deleted

## 2012-12-02 ENCOUNTER — Other Ambulatory Visit (INDEPENDENT_AMBULATORY_CARE_PROVIDER_SITE_OTHER): Payer: BC Managed Care – PPO

## 2012-12-02 DIAGNOSIS — Z0000 Encounter for general adult medical examination without abnormal findings: Secondary | ICD-10-CM

## 2012-12-02 LAB — CBC WITH DIFFERENTIAL/PLATELET
Basophils Absolute: 0 10*3/uL (ref 0.0–0.1)
Eosinophils Relative: 5.2 % — ABNORMAL HIGH (ref 0.0–5.0)
HCT: 37.1 % (ref 36.0–46.0)
Hemoglobin: 12.5 g/dL (ref 12.0–15.0)
Lymphocytes Relative: 37.6 % (ref 12.0–46.0)
Lymphs Abs: 2.2 10*3/uL (ref 0.7–4.0)
Monocytes Relative: 6.5 % (ref 3.0–12.0)
Neutro Abs: 2.9 10*3/uL (ref 1.4–7.7)
RBC: 4.26 Mil/uL (ref 3.87–5.11)
RDW: 12.5 % (ref 11.5–14.6)
WBC: 5.9 10*3/uL (ref 4.5–10.5)

## 2012-12-02 LAB — LIPID PANEL
HDL: 55.7 mg/dL (ref >39.00)
LDL Cholesterol: 104 mg/dL — ABNORMAL HIGH (ref 0–99)
Total CHOL/HDL Ratio: 3
Triglycerides: 93 mg/dL (ref 0.0–149.0)

## 2012-12-02 LAB — BASIC METABOLIC PANEL
CO2: 30 mEq/L (ref 19–32)
Calcium: 9.6 mg/dL (ref 8.4–10.5)
Chloride: 97 mEq/L (ref 96–112)
Creatinine, Ser: 0.9 mg/dL (ref 0.4–1.2)
Glucose, Bld: 103 mg/dL — ABNORMAL HIGH (ref 70–99)

## 2012-12-02 LAB — POCT URINALYSIS DIPSTICK
Nitrite, UA: NEGATIVE
Urobilinogen, UA: 0.2
pH, UA: 5.5

## 2012-12-02 LAB — HEPATIC FUNCTION PANEL
ALT: 24 U/L (ref 0–35)
Albumin: 4.1 g/dL (ref 3.5–5.2)
Total Protein: 8.2 g/dL (ref 6.0–8.3)

## 2012-12-02 MED ORDER — POTASSIUM CHLORIDE ER 10 MEQ PO TBCR: 10.0000 meq | EXTENDED_RELEASE_TABLET | Freq: Two times a day (BID) | ORAL | Status: DC

## 2012-12-05 ENCOUNTER — Ambulatory Visit (INDEPENDENT_AMBULATORY_CARE_PROVIDER_SITE_OTHER): Payer: BC Managed Care – PPO | Admitting: Internal Medicine

## 2012-12-05 ENCOUNTER — Encounter: Payer: Self-pay | Admitting: Internal Medicine

## 2012-12-05 VITALS — BP 116/74 | HR 76 | Temp 98.1°F | Ht 66.0 in | Wt 181.0 lb

## 2012-12-05 DIAGNOSIS — M542 Cervicalgia: Secondary | ICD-10-CM

## 2012-12-05 DIAGNOSIS — I10 Essential (primary) hypertension: Secondary | ICD-10-CM

## 2012-12-05 MED ORDER — TRAMADOL HCL 50 MG PO TABS: 50.0000 mg | ORAL_TABLET | Freq: Two times a day (BID) | ORAL | Status: DC | PRN

## 2012-12-05 MED ORDER — HYDROCHLOROTHIAZIDE 25 MG PO TABS
25.0000 mg | ORAL_TABLET | Freq: Every day | ORAL | Status: DC
Start: 2012-12-05 — End: 2013-05-16

## 2012-12-05 MED ORDER — ALPRAZOLAM 0.25 MG PO TABS: ORAL_TABLET | ORAL | Status: DC

## 2012-12-05 NOTE — Assessment & Plan Note (Signed)
Her blood pressure stable. She had mild hypokalemia on Maxide. She restarted her potassium supplementation. Her 10 year cardiovascular risk is 2.2%.

## 2012-12-05 NOTE — Progress Notes (Signed)
Subjective:    Patient ID: Linda Christian, female    DOB: Apr 24, 1961, 52 y.o.   MRN: [id number]  HPI  52 year old African American female with history of hypertension, borderline type 2 diabetes chronic neck pain for routine followup. Patient reports good compliance with antihypertensives. Her recent blood work showed mild hypokalemia. She was not taking her potassium supplementation as directed.  Patient complains of chronic right-sided neck pain. Her symptoms have been ongoing for almost 2 years. She's tried physical therapy, massage therapy and chiropractic treatment without relief. Her smptoms are progressively getting worse. She has been using over-the-counter nonsteroidal anti-inflammatories but is concerned about long-term use.  Review of Systems Negative for upper extremity weakness, patient works for [employer] (spends significant amount of time in front of a computer )    Past Medical History  Diagnosis Date  . Nipple discharge 09/2011    left  . Hypertension     under control; has been on med. x 8-9 yrs.  . Seasonal allergies   . 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 Eczema     perineal area  . Irregular periods/menstrual cycles     History   Social History  . Marital Status: Married    Spouse Name: N/A    Number of Children: 2  . Years of Education: N/A   Occupational History  .  [employer]   Social History Main Topics  . Smoking status: Former Games developer  . Smokeless tobacco: Never Used     Comment: quit smoking 1982  . Alcohol Use: No  . Drug Use: No  . Sexually Active: Not on file   Other Topics Concern  . Not on file   Social History Narrative   Caffeine use: Rare coffee, no sodas   1 biological child, 1 adopted child          Past Surgical History  Procedure Laterality Date  . Hemorrhoid surgery  04/08/2007  . Breast surgery  10/02/2011  . Breast ductal system excision  10/02/2011    Procedure: EXCISION  DUCTAL SYSTEM BREAST;  Surgeon: Wilmon Arms. Corliss Skains, MD;  Location: [facility];  Service: General;  Laterality: Left;  left nipple duct excision    Family History  Problem Relation Age of Onset  . Hypertension Mother   . Diabetes Mother     type II  . Hypertension Father   . Diabetes Father     type II  . Anesthesia problems Father     renal function slow after anesthesia  . Cancer Neg Hx     negative for colon cancer  . Heart attack Neg Hx   . Hyperlipidemia Neg Hx   . Sudden death Neg Hx     Allergies  Allergen Reactions  . Shellfish Allergy Shortness Of Breath    CLOSES THROAT    Current Outpatient Prescriptions on File Prior to Visit  Medication Sig Dispense Refill  . Ferrous Gluconate (IRON) 240 (27 FE) MG TABS Take 200 mg by mouth every other day.      . fish oil-omega-3 fatty acids 1000 MG capsule Take 2 g by mouth daily.      . fluticasone (FLONASE) 50 MCG/ACT nasal spray 2 sprays by Nasal route daily.  16 g  5  . Multiple Vitamin (MULTIVITAMIN) tablet Take 1 tablet by mouth daily.      . potassium chloride (K-DUR) 10 MEQ tablet Take 1 tablet (  10 mEq total) by mouth 2 (two) times daily.  90 tablet  1   No current facility-administered medications on file prior to visit.    BP 116/74  Pulse 76  Temp(Src) 98.1 F (36.7 C) (Oral)  Ht 5\' 6"  (1.676 m)  Wt 181 lb (82.101 kg)  BMI 29.23 kg/m2    Objective:   Physical Exam  Constitutional: She is oriented to person, place, and time. She appears well-developed and well-nourished.  HENT:  Head: Normocephalic and atraumatic.  Neck: Neck supple.  No carotid bruit  Cardiovascular: Normal rate, regular rhythm and normal heart sounds.   Pulmonary/Chest: Effort normal and breath sounds normal. She has no wheezes.  Musculoskeletal:  Decreased range of motion of cervical spine (flexion, extension rotation and side bending)  Neurological: She is alert and oriented to person, place, and time. She displays  normal reflexes. No cranial nerve deficit. She exhibits normal muscle tone.  Muscle strength upper extremities 5 out of 5  Psychiatric: She has a normal mood and affect. Her behavior is normal.          Assessment & Plan:

## 2012-12-05 NOTE — Assessment & Plan Note (Signed)
Patient has chronic right-sided neck pain. Symptoms have been ongoing for several years. No improvement with physical therapy, massage or chiropractic treatments. Obtain MRI of C-spine.  Use tramadol 50 mg as directed for pain control.  Use alprazolam 0.25 mg 30-60 minutes before MRI.

## 2012-12-05 NOTE — Patient Instructions (Signed)
Our office will contact you re: MRI of C spine results

## 2012-12-11 ENCOUNTER — Ambulatory Visit
Admission: RE | Admit: 2012-12-11 | Discharge: 2012-12-11 | Disposition: A | Discharge status: Home / Self Care | Payer: BC Managed Care – PPO | Source: Ambulatory Visit | Attending: Internal Medicine | Admitting: Internal Medicine

## 2012-12-11 DIAGNOSIS — M542 Cervicalgia: Secondary | ICD-10-CM

## 2012-12-13 ENCOUNTER — Encounter: Payer: Self-pay | Admitting: Internal Medicine

## 2012-12-14 ENCOUNTER — Other Ambulatory Visit: Payer: Self-pay | Admitting: Internal Medicine

## 2012-12-14 DIAGNOSIS — M5030 Other cervical disc degeneration, unspecified cervical region: Secondary | ICD-10-CM

## 2013-03-09 ENCOUNTER — Other Ambulatory Visit: Payer: Self-pay

## 2013-05-08 ENCOUNTER — Telehealth: Payer: Self-pay | Admitting: Internal Medicine

## 2013-05-08 NOTE — Telephone Encounter (Signed)
Cbc, lipid, hepatic, tsh, bmet

## 2013-05-08 NOTE — Telephone Encounter (Signed)
Pt states she usually has labs done priot to appt. She was supposed to return 6/7. pls advise

## 2013-05-09 NOTE — Telephone Encounter (Signed)
appt set/kh 

## 2013-05-12 ENCOUNTER — Other Ambulatory Visit (INDEPENDENT_AMBULATORY_CARE_PROVIDER_SITE_OTHER): Payer: BC Managed Care – PPO

## 2013-05-12 DIAGNOSIS — E785 Hyperlipidemia, unspecified: Secondary | ICD-10-CM

## 2013-05-12 DIAGNOSIS — D649 Anemia, unspecified: Secondary | ICD-10-CM

## 2013-05-12 DIAGNOSIS — I10 Essential (primary) hypertension: Secondary | ICD-10-CM

## 2013-05-12 LAB — BASIC METABOLIC PANEL
BUN: 9 mg/dL (ref 6–23)
CO2: 31 mEq/L (ref 19–32)
Chloride: 98 mEq/L (ref 96–112)
Glucose, Bld: 101 mg/dL — ABNORMAL HIGH (ref 70–99)
Potassium: 3.5 mEq/L (ref 3.5–5.1)
Sodium: 137 mEq/L (ref 135–145)

## 2013-05-12 LAB — CBC WITH DIFFERENTIAL/PLATELET
Eosinophils Relative: 5.5 % — ABNORMAL HIGH (ref 0.0–5.0)
Lymphocytes Relative: 36.1 % (ref 12.0–46.0)
MCV: 86.7 fl (ref 78.0–100.0)
Monocytes Absolute: 0.6 10*3/uL (ref 0.1–1.0)
Monocytes Relative: 8.8 % (ref 3.0–12.0)
Neutrophils Relative %: 49 % (ref 43.0–77.0)
Platelets: 200 10*3/uL (ref 150.0–400.0)
WBC: 6.6 10*3/uL (ref 4.5–10.5)

## 2013-05-12 LAB — LIPID PANEL
HDL: 56.9 mg/dL (ref >39.00)
LDL Cholesterol: 109 mg/dL — ABNORMAL HIGH (ref 0–99)
Total CHOL/HDL Ratio: 3
VLDL: 15.4 mg/dL (ref 0.0–40.0)

## 2013-05-12 LAB — HEPATIC FUNCTION PANEL
ALT: 43 U/L — ABNORMAL HIGH (ref 0–35)
Alkaline Phosphatase: 46 U/L (ref 39–117)
Bilirubin, Direct: 0.1 mg/dL (ref 0.0–0.3)
Total Bilirubin: 1.2 mg/dL (ref 0.3–1.2)

## 2013-05-12 LAB — HEMOGLOBIN A1C: Hgb A1c MFr Bld: 6.7 % — ABNORMAL HIGH (ref 4.6–6.5)

## 2013-05-16 ENCOUNTER — Other Ambulatory Visit: Payer: Self-pay | Admitting: *Deleted

## 2013-05-16 ENCOUNTER — Ambulatory Visit (INDEPENDENT_AMBULATORY_CARE_PROVIDER_SITE_OTHER): Payer: BC Managed Care – PPO | Admitting: Internal Medicine

## 2013-05-16 ENCOUNTER — Encounter: Payer: Self-pay | Admitting: Internal Medicine

## 2013-05-16 VITALS — BP 110/80 | Temp 98.0°F | Wt 178.0 lb

## 2013-05-16 DIAGNOSIS — I10 Essential (primary) hypertension: Secondary | ICD-10-CM

## 2013-05-16 DIAGNOSIS — R7309 Other abnormal glucose: Secondary | ICD-10-CM

## 2013-05-16 DIAGNOSIS — Z23 Encounter for immunization: Secondary | ICD-10-CM

## 2013-05-16 MED ORDER — POTASSIUM CHLORIDE ER 10 MEQ PO TBCR: 10.0000 meq | EXTENDED_RELEASE_TABLET | Freq: Every day | ORAL | Status: DC

## 2013-05-16 MED ORDER — CANAGLIFLOZIN 100 MG PO TABS: 100.0000 mg | ORAL_TABLET | Freq: Every day | ORAL | Status: DC

## 2013-05-16 MED ORDER — HYDROCHLOROTHIAZIDE 25 MG PO TABS: 25.0000 mg | ORAL_TABLET | Freq: Every day | ORAL | Status: DC

## 2013-05-16 NOTE — Progress Notes (Signed)
Subjective:    Patient ID: Linda Christian, female    DOB: [birth year], 52 y.o.   MRN: [id number]  HPI  52 year old African American female with history of hypertension, borderline type 2 diabetes and chronic neck pain for followup. Patient reports dietary compliance has been worse. Her A1c increased to 6.7. She has noticed mild increase in urination but no polydipsia.  Hypertension-well-controlled  Chronic neck pain-she has deferred neck surgery. Neck pain has improved somewhat.   Review of Systems Negative for visual changes, mild tingling in her feet.  Past Medical History  Diagnosis Date  . Nipple discharge 09/2011    left  . Hypertension     under control; has been on med. x 8-9 yrs.  . Seasonal allergies   . 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 Eczema     perineal area  . Irregular periods/menstrual cycles     History   Social History  . Marital Status: Married    Spouse Name: N/A    Number of Children: 2  . Years of Education: N/A   Occupational History  .  [employer]   Social History Main Topics  . Smoking status: Former Games developer  . Smokeless tobacco: Never Used     Comment: quit smoking 1982  . Alcohol Use: No  . Drug Use: No  . Sexual Activity: Not on file   Other Topics Concern  . Not on file   Social History Narrative   Caffeine use: Rare coffee, no sodas   1 biological child, 1 adopted child          Past Surgical History  Procedure Laterality Date  . Hemorrhoid surgery  04/08/2007  . Breast surgery  10/02/2011  . Breast ductal system excision  10/02/2011    Procedure: EXCISION DUCTAL SYSTEM BREAST;  Surgeon: Wilmon Arms. Corliss Skains, MD;  Location: [facility];  Service: General;  Laterality: Left;  left nipple duct excision    Family History  Problem Relation Age of Onset  . Hypertension Mother   . Diabetes Mother     type II  . Hypertension Father   . Diabetes Father     type II  .  Anesthesia problems Father     renal function slow after anesthesia  . Cancer Neg Hx     negative for colon cancer  . Heart attack Neg Hx   . Hyperlipidemia Neg Hx   . Sudden death Neg Hx     Allergies  Allergen Reactions  . Shellfish Allergy Shortness Of Breath    CLOSES THROAT    Current Outpatient Prescriptions on File Prior to Visit  Medication Sig Dispense Refill  . fish oil-omega-3 fatty acids 1000 MG capsule Take 2 g by mouth daily.      . Multiple Vitamin (MULTIVITAMIN) tablet Take 1 tablet by mouth daily.      . traMADol (ULTRAM) 50 MG tablet Take 1 tablet (50 mg total) by mouth every 12 (twelve) hours as needed for pain.  30 tablet  0  . fluticasone (FLONASE) 50 MCG/ACT nasal spray 2 sprays by Nasal route daily.  16 g  5   No current facility-administered medications on file prior to visit.    BP 110/80  Temp(Src) 98 F (36.7 C) (Oral)  Wt 178 lb (80.74 kg)  BMI 28.74 kg/m2       Objective:   Physical Exam  Constitutional: She is oriented  to person, place, and time. She appears well-developed and well-nourished.  Cardiovascular: Normal rate, regular rhythm and normal heart sounds.   No murmur heard. Pulmonary/Chest: Effort normal and breath sounds normal. She has no wheezes.  Neurological: She is alert and oriented to person, place, and time. No cranial nerve deficit.  Psychiatric: She has a normal mood and affect. Her behavior is normal.          Assessment & Plan:

## 2013-05-16 NOTE — Assessment & Plan Note (Signed)
Blood sugar control has worsened. Patient unable to tolerate metformin in the past. Victoza caused mild elevation in lipase but no pancreatitis.  Trial of Invokana.  Monitor renal function.  We will discuss starting statin for primary prevention at next office visit.

## 2013-05-16 NOTE — Patient Instructions (Addendum)
Please complete the following lab tests before your next follow up appointment: BMET - 401.9, 250.00

## 2013-05-16 NOTE — Assessment & Plan Note (Signed)
Well controlled.  BP: 110/80 mmHg

## 2013-05-19 ENCOUNTER — Encounter: Payer: Self-pay | Admitting: Internal Medicine

## 2013-11-04 ENCOUNTER — Emergency Department (HOSPITAL_COMMUNITY): Payer: BC Managed Care – PPO

## 2013-11-04 ENCOUNTER — Emergency Department (HOSPITAL_COMMUNITY)
Admission: EM | Admit: 2013-11-04 | Discharge: 2013-11-04 | Disposition: A | Discharge status: Home / Self Care | Payer: BC Managed Care – PPO | Attending: Emergency Medicine | Admitting: Emergency Medicine

## 2013-11-04 ENCOUNTER — Encounter (HOSPITAL_COMMUNITY): Payer: Self-pay | Admitting: Emergency Medicine

## 2013-11-04 DIAGNOSIS — M129 Arthropathy, unspecified: Secondary | ICD-10-CM | POA: Insufficient documentation

## 2013-11-04 DIAGNOSIS — N949 Unspecified condition associated with female genital organs and menstrual cycle: Secondary | ICD-10-CM | POA: Insufficient documentation

## 2013-11-04 DIAGNOSIS — R3915 Urgency of urination: Secondary | ICD-10-CM | POA: Insufficient documentation

## 2013-11-04 DIAGNOSIS — N938 Other specified abnormal uterine and vaginal bleeding: Secondary | ICD-10-CM | POA: Insufficient documentation

## 2013-11-04 DIAGNOSIS — Z3202 Encounter for pregnancy test, result negative: Secondary | ICD-10-CM | POA: Insufficient documentation

## 2013-11-04 DIAGNOSIS — I10 Essential (primary) hypertension: Secondary | ICD-10-CM | POA: Insufficient documentation

## 2013-11-04 DIAGNOSIS — R109 Unspecified abdominal pain: Secondary | ICD-10-CM | POA: Insufficient documentation

## 2013-11-04 DIAGNOSIS — R300 Dysuria: Secondary | ICD-10-CM | POA: Insufficient documentation

## 2013-11-04 DIAGNOSIS — Z87891 Personal history of nicotine dependence: Secondary | ICD-10-CM | POA: Insufficient documentation

## 2013-11-04 DIAGNOSIS — IMO0002 Reserved for concepts with insufficient information to code with codable children: Secondary | ICD-10-CM | POA: Insufficient documentation

## 2013-11-04 DIAGNOSIS — N925 Other specified irregular menstruation: Secondary | ICD-10-CM | POA: Insufficient documentation

## 2013-11-04 DIAGNOSIS — E785 Hyperlipidemia, unspecified: Secondary | ICD-10-CM | POA: Insufficient documentation

## 2013-11-04 DIAGNOSIS — N926 Irregular menstruation, unspecified: Secondary | ICD-10-CM | POA: Insufficient documentation

## 2013-11-04 DIAGNOSIS — R319 Hematuria, unspecified: Secondary | ICD-10-CM | POA: Insufficient documentation

## 2013-11-04 DIAGNOSIS — J309 Allergic rhinitis, unspecified: Secondary | ICD-10-CM | POA: Insufficient documentation

## 2013-11-04 DIAGNOSIS — Z79899 Other long term (current) drug therapy: Secondary | ICD-10-CM | POA: Insufficient documentation

## 2013-11-04 LAB — CBC WITH DIFFERENTIAL/PLATELET
Basophils Absolute: 0 10*3/uL (ref 0.0–0.1)
Basophils Relative: 0 % (ref 0–1)
EOS ABS: 0.3 10*3/uL (ref 0.0–0.7)
EOS PCT: 4 % (ref 0–5)
HCT: 35.3 % — ABNORMAL LOW (ref 36.0–46.0)
Hemoglobin: 11.9 g/dL — ABNORMAL LOW (ref 12.0–15.0)
LYMPHS ABS: 2.1 10*3/uL (ref 0.7–4.0)
Lymphocytes Relative: 23 % (ref 12–46)
MCH: 29.1 pg (ref 26.0–34.0)
MCHC: 33.7 g/dL (ref 30.0–36.0)
MCV: 86.3 fL (ref 78.0–100.0)
Monocytes Absolute: 0.5 10*3/uL (ref 0.1–1.0)
Monocytes Relative: 5 % (ref 3–12)
Neutro Abs: 5.9 10*3/uL (ref 1.7–7.7)
Neutrophils Relative %: 67 % (ref 43–77)
Platelets: 180 10*3/uL (ref 150–400)
RBC: 4.09 MIL/uL (ref 3.87–5.11)
RDW: 12.9 % (ref 11.5–15.5)
WBC: 8.8 10*3/uL (ref 4.0–10.5)

## 2013-11-04 LAB — I-STAT CHEM 8, ED
BUN: 7 mg/dL (ref 6–23)
CALCIUM ION: 1.17 mmol/L (ref 1.12–1.23)
CREATININE: 1 mg/dL (ref 0.50–1.10)
Chloride: 99 mEq/L (ref 96–112)
Glucose, Bld: 124 mg/dL — ABNORMAL HIGH (ref 70–99)
HCT: 38 % (ref 36.0–46.0)
Hemoglobin: 12.9 g/dL (ref 12.0–15.0)
Potassium: 3 mEq/L — ABNORMAL LOW (ref 3.7–5.3)
SODIUM: 142 meq/L (ref 137–147)
TCO2: 28 mmol/L (ref 0–100)

## 2013-11-04 LAB — URINALYSIS, ROUTINE W REFLEX MICROSCOPIC
BILIRUBIN URINE: NEGATIVE
Glucose, UA: NEGATIVE mg/dL
Ketones, ur: NEGATIVE mg/dL
Leukocytes, UA: NEGATIVE
Nitrite: NEGATIVE
PROTEIN: NEGATIVE mg/dL
Specific Gravity, Urine: 1.017 (ref 1.005–1.030)
Urobilinogen, UA: 0.2 mg/dL (ref 0.0–1.0)
pH: 7.5 (ref 5.0–8.0)

## 2013-11-04 LAB — URINE MICROSCOPIC-ADD ON

## 2013-11-04 LAB — PREGNANCY, URINE: Preg Test, Ur: NEGATIVE

## 2013-11-04 MED ORDER — TRAMADOL HCL 50 MG PO TABS: 50.0000 mg | ORAL_TABLET | Freq: Four times a day (QID) | ORAL | Status: DC | PRN

## 2013-11-04 MED ORDER — POTASSIUM CHLORIDE CRYS ER 20 MEQ PO TBCR
40.0000 meq | EXTENDED_RELEASE_TABLET | Freq: Once | ORAL | Status: AC
  Administered 2013-11-04: 40 meq via ORAL
  Filled 2013-11-04: qty 2

## 2013-11-04 MED ORDER — KETOROLAC TROMETHAMINE 15 MG/ML IJ SOLN
30.0000 mg | Freq: Once | INTRAMUSCULAR | Status: AC
  Administered 2013-11-04: 30 mg via INTRAVENOUS
  Filled 2013-11-04: qty 2

## 2013-11-04 MED ORDER — MORPHINE SULFATE 4 MG/ML IJ SOLN
4.0000 mg | Freq: Once | INTRAMUSCULAR | Status: DC
Start: 2013-11-04 — End: 2013-11-04
  Filled 2013-11-04: qty 1

## 2013-11-04 MED ORDER — ONDANSETRON HCL 4 MG/2ML IJ SOLN
4.0000 mg | Freq: Once | INTRAMUSCULAR | Status: AC
  Administered 2013-11-04: 4 mg via INTRAVENOUS
  Filled 2013-11-04: qty 2

## 2013-11-04 NOTE — ED Notes (Signed)
Pt transported to CT ?

## 2013-11-04 NOTE — ED Provider Notes (Signed)
CSN: [id number]     Arrival date & time 11/04/13  1259 History   First MD Initiated Contact with Patient 11/04/13 1323     Chief Complaint  Patient presents with  . Flank Pain     (Consider location/radiation/quality/duration/timing/severity/associated sxs/prior Treatment) HPI Comments: Patient presents with a three-day history of worsening pain to her right flank. She states it started several days ago in her right back and now radiates to her right mid and lower abdomen. She also has some urinary urgency and burning on urination. She went to urgent care 3 days ago and was told that she had an unremarkable urine however given her symptoms she was treated as a UTI with Cipro. She did not note any improvement of symptoms. She denies any fevers or chills. She denies any nausea or vomiting. She's having normal bowel movements. She denies a history of kidney stones. She denies any history some surgery to her abdomen. She states the pain started gradually and is been worsening since that time. It's been constant and throbbing in nature.  Patient is a 53 y.o. female presenting with flank pain.  Flank Pain Associated symptoms include abdominal pain. Pertinent negatives include no chest pain, no headaches and no shortness of breath.    Past Medical History  Diagnosis Date  . Allergic rhinitis   . Hypertension   . Hx of colonic polyp     hyperplastic 4/08  . Glucose intolerance (impaired glucose tolerance)     diabetes type II  . Hyperlipidemia   . Menometrorrhagia   . Nipple discharge 09/2011    left  . Hypertension     under control; has been on med. x 8-9 yrs.  . Seasonal allergies   . 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 Eczema     perineal area  . Irregular periods/menstrual cycles    Past Surgical History  Procedure Laterality Date  . Colonoscopy w/ polypectomy    . Hemorrhoid surgery      hemorhoidectomy  . Hemorrhoid surgery  04/08/2007  .  Breast surgery  10/02/2011  . Breast ductal system excision  10/02/2011    Procedure: EXCISION DUCTAL SYSTEM BREAST;  Surgeon: Imogene Burn. Georgette Dover, MD;  Location: [facility];  Service: General;  Laterality: Left;  left nipple duct excision   Family History  Problem Relation Age of Onset  . Other      no FH of colon cancer  . Hypertension Mother   . Diabetes Mother     type II  . Hypertension Father   . Diabetes Father     type II  . Anesthesia problems Father     renal function slow after anesthesia  . Cancer Neg Hx     negative for colon cancer  . Heart attack Neg Hx   . Hyperlipidemia Neg Hx   . Sudden death Neg Hx    History  Substance Use Topics  . Smoking status: Former Research scientist (life sciences)  . Smokeless tobacco: Never Used     Comment: quit smoking 1982  . Alcohol Use: No   OB History   Grav Para Term Preterm Abortions TAB SAB Ect Mult Living                 Review of Systems  Constitutional: Negative for fever, chills, diaphoresis and fatigue.  HENT: Negative for congestion, rhinorrhea and sneezing.   Eyes: Negative.   Respiratory: Negative for cough, chest  tightness and shortness of breath.   Cardiovascular: Negative for chest pain and leg swelling.  Gastrointestinal: Positive for abdominal pain. Negative for nausea, vomiting, diarrhea and blood in stool.  Genitourinary: Positive for urgency, frequency, flank pain and vaginal bleeding (Noticed some vaginal bleeding this morning. She has a history of irregular periods). Negative for hematuria, vaginal discharge and difficulty urinating.  Musculoskeletal: Negative for arthralgias and back pain.  Skin: Negative for rash.  Neurological: Negative for dizziness, speech difficulty, weakness, numbness and headaches.      Allergies  Shellfish allergy  Home Medications   Current Outpatient Rx  Name  Route  Sig  Dispense  Refill  . ciprofloxacin (CIPRO) 500 MG tablet   Oral   Take 500 mg by mouth 2 (two) times daily.  For 7 days         . fish oil-omega-3 fatty acids 1000 MG capsule   Oral   Take 2 g by mouth daily.         . hydrochlorothiazide (HYDRODIURIL) 25 MG tablet   Oral   Take 1 tablet (25 mg total) by mouth daily.   90 tablet   3   . Multiple Vitamin (MULTIVITAMIN) tablet   Oral   Take 1 tablet by mouth daily.         Marland Kitchen omeprazole (PRILOSEC) 20 MG capsule   Oral   Take 20 mg by mouth daily.           . potassium chloride (K-DUR) 10 MEQ tablet   Oral   Take 1 tablet (10 mEq total) by mouth daily.   90 tablet   3   . EXPIRED: fluticasone (FLONASE) 50 MCG/ACT nasal spray   Nasal   2 sprays by Nasal route daily.   16 g   5   . traMADol (ULTRAM) 50 MG tablet   Oral   Take 1 tablet (50 mg total) by mouth every 6 (six) hours as needed.   15 tablet   0    BP 134/72  Pulse 97  Temp(Src) 99.3 F (37.4 C) (Oral)  Resp 18  SpO2 97%  LMP 11/04/2013 Physical Exam  Constitutional: She is oriented to person, place, and time. She appears well-developed and well-nourished.  HENT:  Head: Normocephalic and atraumatic.  Eyes: Pupils are equal, round, and reactive to light.  Neck: Normal range of motion. Neck supple.  Cardiovascular: Normal rate, regular rhythm and normal heart sounds.   Pulmonary/Chest: Effort normal and breath sounds normal. No respiratory distress. She has no wheezes. She has no rales. She exhibits no tenderness.  Abdominal: Soft. Bowel sounds are normal. There is tenderness (Moderate tenderness to the right flank and mid and lower abdomen). There is no rebound and no guarding.  Musculoskeletal: Normal range of motion. She exhibits no edema.  Lymphadenopathy:    She has no cervical adenopathy.  Neurological: She is alert and oriented to person, place, and time.  Skin: Skin is warm and dry. No rash noted.  Psychiatric: She has a normal mood and affect.    ED Course  Procedures (including critical care time) Labs Review Results for orders placed  during the hospital encounter of 11/04/13  CBC WITH DIFFERENTIAL      Result Value Ref Range   WBC 8.8  4.0 - 10.5 K/uL   RBC 4.09  3.87 - 5.11 MIL/uL   Hemoglobin 11.9 (*) 12.0 - 15.0 g/dL   HCT 35.3 (*) 36.0 - 46.0 %   MCV 86.3  78.0 - 100.0 fL   MCH 29.1  26.0 - 34.0 pg   MCHC 33.7  30.0 - 36.0 g/dL   RDW 12.9  11.5 - 15.5 %   Platelets 180  150 - 400 K/uL   Neutrophils Relative % 67  43 - 77 %   Neutro Abs 5.9  1.7 - 7.7 K/uL   Lymphocytes Relative 23  12 - 46 %   Lymphs Abs 2.1  0.7 - 4.0 K/uL   Monocytes Relative 5  3 - 12 %   Monocytes Absolute 0.5  0.1 - 1.0 K/uL   Eosinophils Relative 4  0 - 5 %   Eosinophils Absolute 0.3  0.0 - 0.7 K/uL   Basophils Relative 0  0 - 1 %   Basophils Absolute 0.0  0.0 - 0.1 K/uL  URINALYSIS, ROUTINE W REFLEX MICROSCOPIC      Result Value Ref Range   Color, Urine YELLOW  YELLOW   APPearance CLEAR  CLEAR   Specific Gravity, Urine 1.017  1.005 - 1.030   pH 7.5  5.0 - 8.0   Glucose, UA NEGATIVE  NEGATIVE mg/dL   Hgb urine dipstick LARGE (*) NEGATIVE   Bilirubin Urine NEGATIVE  NEGATIVE   Ketones, ur NEGATIVE  NEGATIVE mg/dL   Protein, ur NEGATIVE  NEGATIVE mg/dL   Urobilinogen, UA 0.2  0.0 - 1.0 mg/dL   Nitrite NEGATIVE  NEGATIVE   Leukocytes, UA NEGATIVE  NEGATIVE  PREGNANCY, URINE      Result Value Ref Range   Preg Test, Ur NEGATIVE  NEGATIVE  URINE MICROSCOPIC-ADD ON      Result Value Ref Range   Squamous Epithelial / LPF RARE  RARE   RBC / HPF 7-10  <3 RBC/hpf   Bacteria, UA RARE  RARE  I-STAT CHEM 8, ED      Result Value Ref Range   Sodium 142  137 - 147 mEq/L   Potassium 3.0 (*) 3.7 - 5.3 mEq/L   Chloride 99  96 - 112 mEq/L   BUN 7  6 - 23 mg/dL   Creatinine, Ser 1.00  0.50 - 1.10 mg/dL   Glucose, Bld 124 (*) 70 - 99 mg/dL   Calcium, Ion 1.17  1.12 - 1.23 mmol/L   TCO2 28  0 - 100 mmol/L   Hemoglobin 12.9  12.0 - 15.0 g/dL   HCT 38.0  36.0 - 46.0 %   No results found.   Imaging Review Ct Abdomen Pelvis Wo  Contrast  11/04/2013   CLINICAL DATA:  Right-sided flank pain for the past 10 days.  EXAM: CT ABDOMEN AND PELVIS WITHOUT CONTRAST  TECHNIQUE: Multidetector CT imaging of the abdomen and pelvis was performed following the standard protocol without intravenous contrast.  COMPARISON:  CT of the abdomen and pelvis 12/25/2009.  FINDINGS: Lung Bases: Unremarkable.  Abdomen/Pelvis: There are no abnormal calcifications within the collecting system of either kidney, along the course of either ureter, or within the lumen of the urinary bladder. No hydroureteronephrosis or perinephric stranding to suggest urinary tract obstruction at this time. The unenhanced appearance of the kidneys is unremarkable bilaterally.  Innumerable tiny low-attenuation lesions are scattered throughout the hepatic parenchyma, similar in size, number and pattern of distribution compared to the prior study from 12/25/2009, presumably benign, likely small cysts or biliary hamartomas. The largest of these measures 1.5 cm in the inferior aspect of segment 5. The unenhanced appearance of the gallbladder, pancreas, spleen and right adrenal gland is unremarkable. 1.2 cm  low-attenuation (-2 HU) left adrenal nodule is unchanged, most compatible with a tiny adenoma. Normal appendix. No significant volume of ascites. No pneumoperitoneum. No pathologic distention of small bowel. No definite lymphadenopathy identified within the abdomen or pelvis. Uterus and ovaries are unremarkable in appearance. A tampon is present in the vagina. Urinary bladder is unremarkable in appearance.  Musculoskeletal: There are no aggressive appearing lytic or blastic lesions noted in the visualized portions of the skeleton.  IMPRESSION: 1. No acute findings in the abdomen or pelvis to account for the patient's symptoms. Specifically, no abnormal urinary tract calculi or findings of urinary tract obstruction are noted at this time. 2. Normal appendix. 3. 1.2 cm left adrenal adenoma is  unchanged. 4. Additional incidental findings, as above.   Electronically Signed   By: Vinnie Langton M.D.   On: 11/04/2013 14:57     EKG Interpretation None      MDM   Final diagnoses:  Flank pain    Patient presents with right flank and back pain and associated urinary symptoms. Her urinalysis is unremarkable other than some hematuria. Her CT scan does not show any findings consistent with renal calculi or appendicitis. She has only minimal tenderness on exam currently. She denied the need for any pain medication ED. I feel this could represent a recently passed stone versus a partially treated UTI. She does not appear to have any pelvic pain. She denies vaginal discharge. I advised her to continue taking Cipro. She's going followup with her primary care physician on Monday for recheck. I advised to return here if she has worsening pain fevers or vomiting. She was given a prescription for Ultram to use at home in addition ibuprofen if she needs it.    Malvin Johns, MD 11/04/13 [phone]

## 2013-11-04 NOTE — ED Notes (Addendum)
Pt reports right sided flank pain that radiates to the right lower abdomen that began 1.5 weeks ago. Pt reports going to an urgent care on Monday, which they conducted blood and urine tests. The patient reports that all her test results were normal, however was prescribed antibiotics for a UTI. Pt was instructed if her symptoms didn't improve to report to the ED for evaluation for possible CT scan to R/O appendicitis. Pt denies nausea, emesis, or diarrhea.

## 2013-11-04 NOTE — Discharge Instructions (Signed)
Abdominal Pain, Adult °Many things can cause abdominal pain. Usually, abdominal pain is not caused by a disease and will improve without treatment. It can often be observed and treated at home. Your health care provider will do a physical exam and possibly order blood tests and X-rays to help determine the seriousness of your pain. However, in many cases, more time must pass before a clear cause of the pain can be found. Before that point, your health care provider may not know if you need more testing or further treatment. °HOME CARE INSTRUCTIONS  °Monitor your abdominal pain for any changes. The following actions may help to alleviate any discomfort you are experiencing: °· Only take over-the-counter or prescription medicines as directed by your health care provider. °· Do not take laxatives unless directed to do so by your health care provider. °· Try a clear liquid diet (broth, tea, or water) as directed by your health care provider. Slowly move to a bland diet as tolerated. °SEEK MEDICAL CARE IF: °· You have unexplained abdominal pain. °· You have abdominal pain associated with nausea or diarrhea. °· You have pain when you urinate or have a bowel movement. °· You experience abdominal pain that wakes you in the night. °· You have abdominal pain that is worsened or improved by eating food. °· You have abdominal pain that is worsened with eating fatty foods. °SEEK IMMEDIATE MEDICAL CARE IF:  °· Your pain does not go away within 2 hours. °· You have a fever. °· You keep throwing up (vomiting). °· Your pain is felt only in portions of the abdomen, such as the right side or the left lower portion of the abdomen. °· You pass bloody or black tarry stools. °MAKE SURE YOU: °· Understand these instructions.   °· Will watch your condition.   °· Will get help right away if you are not doing well or get worse.   °Document Released: 05/27/2005 Document Revised: 06/07/2013 Document Reviewed: 04/26/2013 °[employer]® Patient  Information ©2014 [employer]. ° °

## 2013-11-06 LAB — URINE CULTURE: Special Requests: NORMAL

## 2013-11-16 ENCOUNTER — Encounter: Payer: Self-pay | Admitting: Internal Medicine

## 2013-11-29 ENCOUNTER — Ambulatory Visit (INDEPENDENT_AMBULATORY_CARE_PROVIDER_SITE_OTHER): Payer: BC Managed Care – PPO | Admitting: Physician Assistant

## 2013-11-29 ENCOUNTER — Encounter: Payer: Self-pay | Admitting: Physician Assistant

## 2013-11-29 VITALS — BP 112/78 | HR 100 | Temp 98.4°F | Resp 16 | Ht 66.0 in | Wt 180.2 lb

## 2013-11-29 DIAGNOSIS — R05 Cough: Secondary | ICD-10-CM

## 2013-11-29 DIAGNOSIS — R059 Cough, unspecified: Secondary | ICD-10-CM

## 2013-11-29 DIAGNOSIS — J329 Chronic sinusitis, unspecified: Secondary | ICD-10-CM

## 2013-11-29 MED ORDER — BENZONATATE 100 MG PO CAPS: 100.0000 mg | ORAL_CAPSULE | Freq: Two times a day (BID) | ORAL | Status: DC | PRN

## 2013-11-29 MED ORDER — AZITHROMYCIN 250 MG PO TABS: ORAL_TABLET | ORAL | Status: DC

## 2013-11-29 NOTE — Patient Instructions (Signed)
Please take antibiotic as prescribed.  Increase fluid intake. Continue Coricedin. Use tessalon perles as directed for cough.  Use Flonase daily.  Place a humidifier in the bedroom.  Call or return to clinic if symptoms are not improving.  Sinusitis Sinusitis is redness, soreness, and swelling (inflammation) of the paranasal sinuses. Paranasal sinuses are air pockets within the bones of your face (beneath the eyes, the middle of the forehead, or above the eyes). In healthy paranasal sinuses, mucus is able to drain out, and air is able to circulate through them by way of your nose. However, when your paranasal sinuses are inflamed, mucus and air can become trapped. This can allow bacteria and other germs to grow and cause infection. Sinusitis can develop quickly and last only a short time (acute) or continue over a long period (chronic). Sinusitis that lasts for more than 12 weeks is considered chronic.  CAUSES  Causes of sinusitis include:  Allergies.  Structural abnormalities, such as displacement of the cartilage that separates your nostrils (deviated septum), which can decrease the air flow through your nose and sinuses and affect sinus drainage.  Functional abnormalities, such as when the small hairs (cilia) that line your sinuses and help remove mucus do not work properly or are not present. SYMPTOMS  Symptoms of acute and chronic sinusitis are the same. The primary symptoms are pain and pressure around the affected sinuses. Other symptoms include:  Upper toothache.  Earache.  Headache.  Bad breath.  Decreased sense of smell and taste.  A cough, which worsens when you are lying flat.  Fatigue.  Fever.  Thick drainage from your nose, which often is green and may contain pus (purulent).  Swelling and warmth over the affected sinuses. DIAGNOSIS  Your caregiver will perform a physical exam. During the exam, your caregiver may:  Look in your nose for signs of abnormal growths in  your nostrils (nasal polyps).  Tap over the affected sinus to check for signs of infection.  View the inside of your sinuses (endoscopy) with a special imaging device with a light attached (endoscope), which is inserted into your sinuses. If your caregiver suspects that you have chronic sinusitis, one or more of the following tests may be recommended:  Allergy tests.  Nasal culture A sample of mucus is taken from your nose and sent to a lab and screened for bacteria.  Nasal cytology A sample of mucus is taken from your nose and examined by your caregiver to determine if your sinusitis is related to an allergy. TREATMENT  Most cases of acute sinusitis are related to a viral infection and will resolve on their own within 10 days. Sometimes medicines are prescribed to help relieve symptoms (pain medicine, decongestants, nasal steroid sprays, or saline sprays).  However, for sinusitis related to a bacterial infection, your caregiver will prescribe antibiotic medicines. These are medicines that will help kill the bacteria causing the infection.  Rarely, sinusitis is caused by a fungal infection. In theses cases, your caregiver will prescribe antifungal medicine. For some cases of chronic sinusitis, surgery is needed. Generally, these are cases in which sinusitis recurs more than 3 times per year, despite other treatments. HOME CARE INSTRUCTIONS   Drink plenty of water. Water helps thin the mucus so your sinuses can drain more easily.  Use a humidifier.  Inhale steam 3 to 4 times a day (for example, sit in the bathroom with the shower running).  Apply a warm, moist washcloth to your face 3 to 4  times a day, or as directed by your caregiver.  Use saline nasal sprays to help moisten and clean your sinuses.  Take over-the-counter or prescription medicines for pain, discomfort, or fever only as directed by your caregiver. SEEK IMMEDIATE MEDICAL CARE IF:  You have increasing pain or severe  headaches.  You have nausea, vomiting, or drowsiness.  You have swelling around your face.  You have vision problems.  You have a stiff neck.  You have difficulty breathing. MAKE SURE YOU:   Understand these instructions.  Will watch your condition.  Will get help right away if you are not doing well or get worse. Document Released: 08/17/2005 Document Revised: 11/09/2011 Document Reviewed: 09/01/2011 [facility] Patient Information 2014 [place], [place].

## 2013-11-29 NOTE — Assessment & Plan Note (Signed)
Rx Azithromycin.  Increase fluids.  Rest.  Saline nasal spray.  Rx Tussionex for cough.  Mucinex.  Humidifier in bedroom.

## 2013-11-29 NOTE — Progress Notes (Signed)
Patient presents to clinic today c/o sinus pressure, sinus pain, tooth pain, intermittent fevers, nonproductive cough.  Denies ear pain.  Denies shortness of breath or wheezing.  Denies recent travel or sick contact. Endorses mild seasonal allergies.  Has taken Coriceden HBP and Alka-Seltzer for symptoms with minimal relief.   Past Medical History  Diagnosis Date  . Allergic rhinitis   . Hypertension   . Hx of colonic polyp     hyperplastic 4/08  . Glucose intolerance (impaired glucose tolerance)     diabetes type II  . Hyperlipidemia   . Menometrorrhagia   . Nipple discharge 09/2011    left  . Hypertension     under control; has been on med. x 8-9 yrs.  . Seasonal allergies   . 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 Eczema     perineal area  . Irregular periods/menstrual cycles     Current Outpatient Prescriptions on File Prior to Visit  Medication Sig Dispense Refill  . fish oil-omega-3 fatty acids 1000 MG capsule Take 2 g by mouth daily.      . hydrochlorothiazide (HYDRODIURIL) 25 MG tablet Take 1 tablet (25 mg total) by mouth daily.  90 tablet  3  . Multiple Vitamin (MULTIVITAMIN) tablet Take 1 tablet by mouth daily.      . potassium chloride (K-DUR) 10 MEQ tablet Take 1 tablet (10 mEq total) by mouth daily.  90 tablet  3  . fluticasone (FLONASE) 50 MCG/ACT nasal spray 2 sprays by Nasal route daily.  16 g  5   No current facility-administered medications on file prior to visit.   Allergies  Allergen Reactions  . Shellfish Allergy Shortness Of Breath    CLOSES THROAT    Family History  Problem Relation Age of Onset  . Other      no FH of colon cancer  . Hypertension Mother   . Diabetes Mother     type II  . Hypertension Father   . Diabetes Father     type II  . Anesthesia problems Father     renal function slow after anesthesia  . Cancer Neg Hx     negative for colon cancer  . Heart attack Neg Hx   . Hyperlipidemia Neg Hx    . Sudden death Neg Hx    History   Social History  . Marital Status: Married    Spouse Name: N/A    Number of Children: 2  . Years of Education: N/A   Occupational History  .  [employer]   Social History Main Topics  . Smoking status: Former Research scientist (life sciences)  . Smokeless tobacco: Never Used     Comment: quit smoking 1982  . Alcohol Use: No  . Drug Use: No  . Sexual Activity: None   Other Topics Concern  . None   Social History Narrative   ** Merged History Encounter **       ** Data from: 05/16/13 Enc Dept: [facility]   Caffeine use: Rare coffee, no sodas   1 biological child, 1 adopted child             ** Data from: 12/20/10 Enc Dept: [facility]   Married   2 children - 1 biol, 1 adopted   Former Smoker      Alcohol use-no     Occupation:  Production designer, theatre/television/film          Daily Caffeine Use  rare coffee--- no sodas   Review of Systems - See HPI.  All other ROS are negative.  BP 112/78  Pulse 100  Temp(Src) 98.4 F (36.9 C) (Oral)  Resp 16  Ht 5\' 6"  (1.676 m)  Wt 180 lb 4 oz (81.761 kg)  BMI 29.11 kg/m2  SpO2 99%  LMP 11/04/2013  Physical Exam  Vitals reviewed. Constitutional: She is oriented to person, place, and time.  HENT:  Head: Normocephalic and atraumatic.  Right Ear: External ear normal.  Left Ear: External ear normal.  Nose: Nose normal.  Mouth/Throat: Oropharynx is clear and moist. No oropharyngeal exudate.  TM within normal limits. + TTP of sinuses noted with R>L  Eyes: Conjunctivae are normal. Pupils are equal, round, and reactive to light.  Neck: Neck supple.  Cardiovascular: Normal rate, regular rhythm, normal heart sounds and intact distal pulses.   Pulmonary/Chest: Effort normal and breath sounds normal. No respiratory distress. She has no wheezes. She has no rales. She exhibits no tenderness.  Lymphadenopathy:    She has no cervical adenopathy.  Neurological: She is alert and oriented to person, place, and time.  Skin: Skin is warm and  dry. No rash noted.  Psychiatric: Affect normal.    Recent Results (from the past 2160 hour(s))  CBC WITH DIFFERENTIAL     Status: Abnormal   Collection Time    11/04/13  1:58 PM      Result Value Ref Range   WBC 8.8  4.0 - 10.5 K/uL   RBC 4.09  3.87 - 5.11 MIL/uL   Hemoglobin 11.9 (*) 12.0 - 15.0 g/dL   HCT 35.3 (*) 36.0 - 46.0 %   MCV 86.3  78.0 - 100.0 fL   MCH 29.1  26.0 - 34.0 pg   MCHC 33.7  30.0 - 36.0 g/dL   RDW 12.9  11.5 - 15.5 %   Platelets 180  150 - 400 K/uL   Neutrophils Relative % 67  43 - 77 %   Neutro Abs 5.9  1.7 - 7.7 K/uL   Lymphocytes Relative 23  12 - 46 %   Lymphs Abs 2.1  0.7 - 4.0 K/uL   Monocytes Relative 5  3 - 12 %   Monocytes Absolute 0.5  0.1 - 1.0 K/uL   Eosinophils Relative 4  0 - 5 %   Eosinophils Absolute 0.3  0.0 - 0.7 K/uL   Basophils Relative 0  0 - 1 %   Basophils Absolute 0.0  0.0 - 0.1 K/uL  I-STAT CHEM 8, ED     Status: Abnormal   Collection Time    11/04/13  2:05 PM      Result Value Ref Range   Sodium 142  137 - 147 mEq/L   Potassium 3.0 (*) 3.7 - 5.3 mEq/L   Chloride 99  96 - 112 mEq/L   BUN 7  6 - 23 mg/dL   Creatinine, Ser 1.00  0.50 - 1.10 mg/dL   Glucose, Bld 124 (*) 70 - 99 mg/dL   Calcium, Ion 1.17  1.12 - 1.23 mmol/L   TCO2 28  0 - 100 mmol/L   Hemoglobin 12.9  12.0 - 15.0 g/dL   HCT 38.0  36.0 - 46.0 %  URINALYSIS, ROUTINE W REFLEX MICROSCOPIC     Status: Abnormal   Collection Time    11/04/13  2:05 PM      Result Value Ref Range   Color, Urine YELLOW  YELLOW   APPearance CLEAR  CLEAR  Specific Gravity, Urine 1.017  1.005 - 1.030   pH 7.5  5.0 - 8.0   Glucose, UA NEGATIVE  NEGATIVE mg/dL   Hgb urine dipstick LARGE (*) NEGATIVE   Bilirubin Urine NEGATIVE  NEGATIVE   Ketones, ur NEGATIVE  NEGATIVE mg/dL   Protein, ur NEGATIVE  NEGATIVE mg/dL   Urobilinogen, UA 0.2  0.0 - 1.0 mg/dL   Nitrite NEGATIVE  NEGATIVE   Leukocytes, UA NEGATIVE  NEGATIVE  PREGNANCY, URINE     Status: None   Collection Time     11/04/13  2:05 PM      Result Value Ref Range   Preg Test, Ur NEGATIVE  NEGATIVE   Comment:            THE SENSITIVITY OF THIS     METHODOLOGY IS >20 mIU/mL.  URINE MICROSCOPIC-ADD ON     Status: None   Collection Time    11/04/13  2:05 PM      Result Value Ref Range   Squamous Epithelial / LPF RARE  RARE   RBC / HPF 7-10  <3 RBC/hpf   Bacteria, UA RARE  RARE  URINE CULTURE     Status: None   Collection Time    11/04/13  2:05 PM      Result Value Ref Range   Specimen Description URINE, CLEAN CATCH     Special Requests Normal     Culture  Setup Time       Value: 11/05/2013 05:00     Performed at [facility]       Value: 20,OOO COLONIES/ML     Performed at [facility]   Culture       Value: Multiple bacterial morphotypes present, none predominant. Suggest appropriate recollection if clinically indicated.     Performed at [facility]   Report Status 11/06/2013 FINAL      Assessment/Plan: Sinusitis Rx Azithromycin.  Increase fluids.  Rest.  Saline nasal spray.  Rx Tussionex for cough.  Mucinex.  Humidifier in bedroom.

## 2013-11-29 NOTE — Progress Notes (Signed)
Pre visit review using our clinic review tool, if applicable. No additional management support is needed unless otherwise documented below in the visit note/SLS  

## 2014-04-16 ENCOUNTER — Encounter: Payer: Self-pay | Admitting: Physician Assistant

## 2014-04-16 ENCOUNTER — Ambulatory Visit (INDEPENDENT_AMBULATORY_CARE_PROVIDER_SITE_OTHER): Payer: BC Managed Care – PPO | Admitting: Physician Assistant

## 2014-04-16 VITALS — BP 116/84 | HR 86 | Temp 98.2°F | Resp 16 | Ht 66.0 in | Wt 181.0 lb

## 2014-04-16 DIAGNOSIS — H6980 Other specified disorders of Eustachian tube, unspecified ear: Secondary | ICD-10-CM

## 2014-04-16 DIAGNOSIS — H6981 Other specified disorders of Eustachian tube, right ear: Secondary | ICD-10-CM

## 2014-04-16 MED ORDER — FLUTICASONE PROPIONATE 50 MCG/ACT NA SUSP: 2.0000 | Freq: Every day | NASAL | Status: DC | PRN

## 2014-04-16 NOTE — Progress Notes (Signed)
Pre visit review using our clinic review tool, if applicable. No additional management support is needed unless otherwise documented below in the visit note/SLS  

## 2014-04-16 NOTE — Patient Instructions (Signed)
Please use Flonase daily.  Take a daily Claritin.  Limit salt intake. Call if symptoms are not improving.  You may need evaluation by ENT and a formal hearing test.

## 2014-04-17 DIAGNOSIS — H6990 Unspecified Eustachian tube disorder, unspecified ear: Secondary | ICD-10-CM | POA: Insufficient documentation

## 2014-04-17 DIAGNOSIS — H6980 Other specified disorders of Eustachian tube, unspecified ear: Secondary | ICD-10-CM | POA: Insufficient documentation

## 2014-04-17 NOTE — Progress Notes (Signed)
Patient presents to clinic today c/o pressure in the right ear x 1 week.  Patient endorses muffled hearing of r ear.  Denies drainage.  Denies dizziness and tinnitus.  Denies hx of trauma.  Does endorse occasional popping sensation.  Past Medical History  Diagnosis Date  . Allergic rhinitis   . Hypertension   . Hx of colonic polyp     hyperplastic 4/08  . Glucose intolerance (impaired glucose tolerance)     diabetes type II  . Hyperlipidemia   . Menometrorrhagia   . Nipple discharge 09/2011    left  . Hypertension     under control; has been on med. x 8-9 yrs.  . Seasonal allergies   . 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 Eczema     perineal area  . Irregular periods/menstrual cycles     Current Outpatient Prescriptions on File Prior to Visit  Medication Sig Dispense Refill  . fish oil-omega-3 fatty acids 1000 MG capsule Take 2 g by mouth daily.      . hydrochlorothiazide (HYDRODIURIL) 25 MG tablet Take 1 tablet (25 mg total) by mouth daily.  90 tablet  3  . Multiple Vitamin (MULTIVITAMIN) tablet Take 1 tablet by mouth daily.      . potassium chloride (K-DUR) 10 MEQ tablet Take 1 tablet (10 mEq total) by mouth daily.  90 tablet  3   No current facility-administered medications on file prior to visit.    Allergies  Allergen Reactions  . Shellfish Allergy Shortness Of Breath    CLOSES THROAT    Family History  Problem Relation Age of Onset  . Other      no FH of colon cancer  . Hypertension Mother   . Diabetes Mother     type II  . Hypertension Father   . Diabetes Father     type II  . Anesthesia problems Father     renal function slow after anesthesia  . Cancer Neg Hx     negative for colon cancer  . Heart attack Neg Hx   . Hyperlipidemia Neg Hx   . Sudden death Neg Hx     History   Social History  . Marital Status: Married    Spouse Name: N/A    Number of Children: 2  . Years of Education: N/A   Occupational  History  .  [employer]   Social History Main Topics  . Smoking status: Former Research scientist (life sciences)  . Smokeless tobacco: Never Used     Comment: quit smoking 1982  . Alcohol Use: No  . Drug Use: No  . Sexual Activity: None   Other Topics Concern  . None   Social History Narrative   ** Merged History Encounter **       ** Data from: 05/16/13 Enc Dept: [facility]   Caffeine use: Rare coffee, no sodas   1 biological child, 1 adopted child             ** Data from: 12/20/10 Enc Dept: [facility]   Married   2 children - 1 biol, 1 adopted   Former Smoker      Alcohol use-no     Occupation:  Production designer, theatre/television/film          Daily Caffeine Use rare coffee--- no sodas   Review of Systems - See HPI.  All other ROS are negative.  BP 116/84  Pulse 86  Temp(Src) 98.2  F (36.8 C) (Oral)  Resp 16  Ht 5\' 6"  (1.676 m)  Wt 181 lb (82.101 kg)  BMI 29.23 kg/m2  SpO2 98%  LMP 01/12/2014  Physical Exam  Vitals reviewed. Constitutional: She is oriented to person, place, and time and well-developed, well-nourished, and in no distress.  HENT:  Head: Normocephalic and atraumatic.  Right Ear: External ear and ear canal normal. A middle ear effusion is present.  Left Ear: Tympanic membrane, external ear and ear canal normal.  Nose: Nose normal. No mucosal edema or rhinorrhea. Right sinus exhibits no maxillary sinus tenderness and no frontal sinus tenderness. Left sinus exhibits no maxillary sinus tenderness and no frontal sinus tenderness.  Mouth/Throat: Uvula is midline, oropharynx is clear and moist and mucous membranes are normal.  Eyes: Conjunctivae are normal.  Neck: Neck supple.  Cardiovascular: Normal rate, regular rhythm, normal heart sounds and intact distal pulses.   Pulmonary/Chest: Effort normal and breath sounds normal. No respiratory distress. She has no wheezes. She has no rales. She exhibits no tenderness.  Neurological: She is alert and oriented to person, place, and time.  Skin:  Skin is warm and dry. No rash noted.   Assessment/Plan: Eustachian tube dysfunction No evidence of AOE or AOM.  Mild fluid noted 2/2 ETD. Restart Flonase.  Daily claritin or Zyrtec. Decrease salt intake. May need ENT referral for evaluation and audiometry if symptoms do not improve with conservative measures.

## 2014-04-17 NOTE — Assessment & Plan Note (Signed)
No evidence of AOE or AOM.  Mild fluid noted 2/2 ETD. Restart Flonase.  Daily claritin or Zyrtec. Decrease salt intake. May need ENT referral for evaluation and audiometry if symptoms do not improve with conservative measures.

## 2014-04-24 ENCOUNTER — Telehealth: Payer: Self-pay | Admitting: Physician Assistant

## 2014-04-24 DIAGNOSIS — H6993 Unspecified Eustachian tube disorder, bilateral: Secondary | ICD-10-CM

## 2014-04-24 DIAGNOSIS — H8113 Benign paroxysmal vertigo, bilateral: Secondary | ICD-10-CM

## 2014-04-24 DIAGNOSIS — H6983 Other specified disorders of Eustachian tube, bilateral: Secondary | ICD-10-CM

## 2014-04-24 NOTE — Telephone Encounter (Signed)
Requesting referral to ENT, spinning sensation is not getting any better

## 2014-04-24 NOTE — Telephone Encounter (Signed)
Referral placed.

## 2014-05-11 ENCOUNTER — Other Ambulatory Visit: Payer: Self-pay | Admitting: Internal Medicine

## 2014-06-12 ENCOUNTER — Other Ambulatory Visit: Payer: Self-pay

## 2014-06-12 DIAGNOSIS — Z1231 Encounter for screening mammogram for malignant neoplasm of breast: Secondary | ICD-10-CM

## 2014-06-18 ENCOUNTER — Ambulatory Visit
Admission: RE | Admit: 2014-06-18 | Discharge: 2014-06-18 | Disposition: A | Discharge status: Home / Self Care | Payer: BC Managed Care – PPO | Source: Ambulatory Visit

## 2014-06-18 DIAGNOSIS — Z1231 Encounter for screening mammogram for malignant neoplasm of breast: Secondary | ICD-10-CM

## 2014-07-16 ENCOUNTER — Other Ambulatory Visit (INDEPENDENT_AMBULATORY_CARE_PROVIDER_SITE_OTHER): Payer: BC Managed Care – PPO

## 2014-07-16 DIAGNOSIS — Z0000 Encounter for general adult medical examination without abnormal findings: Secondary | ICD-10-CM

## 2014-07-16 LAB — CBC WITH DIFFERENTIAL/PLATELET
BASOS ABS: 0 10*3/uL (ref 0.0–0.1)
Basophils Relative: 0.6 % (ref 0.0–3.0)
EOS PCT: 5.5 % — AB (ref 0.0–5.0)
Eosinophils Absolute: 0.3 10*3/uL (ref 0.0–0.7)
HCT: 38.9 % (ref 36.0–46.0)
Hemoglobin: 12.8 g/dL (ref 12.0–15.0)
Lymphocytes Relative: 41.5 % (ref 12.0–46.0)
Lymphs Abs: 2.4 10*3/uL (ref 0.7–4.0)
MCHC: 33 g/dL (ref 30.0–36.0)
MCV: 86.6 fl (ref 78.0–100.0)
MONOS PCT: 5.3 % (ref 3.0–12.0)
Monocytes Absolute: 0.3 10*3/uL (ref 0.1–1.0)
NEUTROS PCT: 47.1 % (ref 43.0–77.0)
Neutro Abs: 2.7 10*3/uL (ref 1.4–7.7)
PLATELETS: 218 10*3/uL (ref 150.0–400.0)
RBC: 4.49 Mil/uL (ref 3.87–5.11)
RDW: 13.4 % (ref 11.5–15.5)
WBC: 5.8 10*3/uL (ref 4.0–10.5)

## 2014-07-16 LAB — BASIC METABOLIC PANEL
BUN: 10 mg/dL (ref 6–23)
CO2: 30 mEq/L (ref 19–32)
Calcium: 9.6 mg/dL (ref 8.4–10.5)
Chloride: 100 mEq/L (ref 96–112)
Creatinine, Ser: 0.9 mg/dL (ref 0.4–1.2)
GFR: 82.13 mL/min (ref >60.00)
GLUCOSE: 104 mg/dL — AB (ref 70–99)
Potassium: 3.5 mEq/L (ref 3.5–5.1)
Sodium: 137 mEq/L (ref 135–145)

## 2014-07-16 LAB — LIPID PANEL
CHOLESTEROL: 165 mg/dL (ref 0–200)
HDL: 48.5 mg/dL (ref >39.00)
LDL Cholesterol: 94 mg/dL (ref 0–99)
NONHDL: 116.5
Total CHOL/HDL Ratio: 3
Triglycerides: 111 mg/dL (ref 0.0–149.0)
VLDL: 22.2 mg/dL (ref 0.0–40.0)

## 2014-07-16 LAB — HEMOGLOBIN A1C: HEMOGLOBIN A1C: 6.6 % — AB (ref 4.6–6.5)

## 2014-07-16 LAB — MICROALBUMIN / CREATININE URINE RATIO
Creatinine,U: 133.7 mg/dL
MICROALB/CREAT RATIO: 1.3 mg/g (ref 0.0–30.0)
Microalb, Ur: 1.7 mg/dL (ref 0.0–1.9)

## 2014-07-16 LAB — HEPATIC FUNCTION PANEL
ALT: 39 U/L — AB (ref 0–35)
AST: 27 U/L (ref 0–37)
Albumin: 4.2 g/dL (ref 3.5–5.2)
Alkaline Phosphatase: 47 U/L (ref 39–117)
BILIRUBIN DIRECT: 0 mg/dL (ref 0.0–0.3)
Total Bilirubin: 0.4 mg/dL (ref 0.2–1.2)
Total Protein: 8.1 g/dL (ref 6.0–8.3)

## 2014-07-16 LAB — POCT URINALYSIS DIPSTICK
Bilirubin, UA: NEGATIVE
GLUCOSE UA: NEGATIVE
KETONES UA: NEGATIVE
Leukocytes, UA: NEGATIVE
Nitrite, UA: NEGATIVE
PH UA: 6.5
Protein, UA: NEGATIVE
Spec Grav, UA: 1.015
Urobilinogen, UA: 0.2

## 2014-07-16 LAB — TSH: TSH: 0.47 u[IU]/mL (ref 0.35–4.50)

## 2014-07-16 NOTE — Addendum Note (Signed)
Addended by: Elmer Picker on: 07/16/2014 08:41 AM   Modules accepted: Orders

## 2014-07-23 ENCOUNTER — Ambulatory Visit (INDEPENDENT_AMBULATORY_CARE_PROVIDER_SITE_OTHER): Payer: BC Managed Care – PPO | Admitting: Internal Medicine

## 2014-07-23 ENCOUNTER — Encounter: Payer: Self-pay | Admitting: Internal Medicine

## 2014-07-23 VITALS — BP 118/90 | HR 86 | Temp 98.1°F | Ht 66.0 in | Wt 177.0 lb

## 2014-07-23 DIAGNOSIS — I10 Essential (primary) hypertension: Secondary | ICD-10-CM

## 2014-07-23 DIAGNOSIS — E119 Type 2 diabetes mellitus without complications: Secondary | ICD-10-CM

## 2014-07-23 DIAGNOSIS — Z0000 Encounter for general adult medical examination without abnormal findings: Secondary | ICD-10-CM

## 2014-07-23 MED ORDER — HYDROCHLOROTHIAZIDE 12.5 MG PO TABS: 12.5000 mg | ORAL_TABLET | Freq: Every day | ORAL | Status: DC

## 2014-07-23 MED ORDER — LOSARTAN POTASSIUM 25 MG PO TABS: 25.0000 mg | ORAL_TABLET | Freq: Every day | ORAL | Status: DC

## 2014-07-23 NOTE — Assessment & Plan Note (Signed)
Reviewed adult health maintenance protocols.  Pap / pelvic, breast exam and mammogram managed by her GYN.  Adult immunizations reviewed.  Follow up colonoscopy as per Dr. Olevia Perches.

## 2014-07-23 NOTE — Patient Instructions (Signed)
Please complete the following lab tests within 2 weeks: BMET - 401.9

## 2014-07-23 NOTE — Progress Notes (Signed)
Pre visit review using our clinic review tool, if applicable. No additional management support is needed unless otherwise documented below in the visit note. 

## 2014-07-23 NOTE — Progress Notes (Signed)
Subjective:    Patient ID: Linda Christian, female    DOB: 30-Jul-1961, 53 y.o.   MRN: [id number]  HPI  52 year old African-American female with history of type 2 diabetes and hypertension for routine physical. Patient denies significant interval medical history. Patient reports following a gluten-free diet. She has also limited her protein intake to fish. Her dietary changes result in significant improvement in overall musculoskeletal aches and pains.  She completed colonoscopy in 11/2006 - Dr. Olevia Perches.  She plans repeat surveillance colonoscopy.  She is followed by her gynecologist for routine Pap/pelvic, breast exam and mammogram.  DM II - blood sugars stable.   She follows healthy diet.  Hypertension - stable.    Recent blood work reviewed in detail with patient. Review of Systems  Constitutional: Negative for activity change, appetite change and unexpected weight change.  Eyes: Negative for visual disturbance.  Respiratory: Negative for cough, chest tightness and shortness of breath.   Cardiovascular: Negative for chest pain.  Genitourinary: Negative for difficulty urinating.  Neurological: Negative for headaches.  Gastrointestinal: Negative for abdominal pain, heartburn melena or hematochezia Psych: Negative for depression or anxiety Endo:  No polyuria or polydypsia        Past Medical History  Diagnosis Date  . Allergic rhinitis   . Hypertension   . Hx of colonic polyp     hyperplastic 4/08  . Glucose intolerance (impaired glucose tolerance)     diabetes type II  . Hyperlipidemia   . Menometrorrhagia   . Nipple discharge 09/2011    left  . Hypertension     under control; has been on med. x 8-9 yrs.  . Seasonal allergies   . 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 Eczema     perineal area  . Irregular periods/menstrual cycles     History   Social History  . Marital Status: Married    Spouse Name: N/A    Number of  Children: 2  . Years of Education: N/A   Occupational History  .  [employer]   Social History Main Topics  . Smoking status: Former Research scientist (life sciences)  . Smokeless tobacco: Never Used     Comment: quit smoking 1982  . Alcohol Use: No  . Drug Use: No  . Sexual Activity: Not on file   Other Topics Concern  . Not on file   Social History Narrative   ** Merged History Encounter **       ** Data from: 05/16/13 Enc Dept: [facility]   Caffeine use: Rare coffee, no sodas   1 biological child, 1 adopted child             ** Data from: 12/20/10 Enc Dept: [facility]   Married   2 children - 1 biol, 1 adopted   Former Smoker      Alcohol use-no     Occupation:  Production designer, theatre/television/film          Daily Caffeine Use rare coffee--- no sodas    Past Surgical History  Procedure Laterality Date  . Colonoscopy w/ polypectomy    . Hemorrhoid surgery      hemorhoidectomy  . Hemorrhoid surgery  04/08/2007  . Breast surgery  10/02/2011  . Breast ductal system excision  10/02/2011    Procedure: EXCISION DUCTAL SYSTEM BREAST;  Surgeon: Imogene Burn. Georgette Dover, MD;  Location: [facility];  Service: General;  Laterality: Left;  left nipple duct  excision    Family History  Problem Relation Age of Onset  . Other      no FH of colon cancer  . Hypertension Mother   . Diabetes Mother     type II  . Hypertension Father   . Diabetes Father     type II  . Anesthesia problems Father     renal function slow after anesthesia  . Cancer Neg Hx     negative for colon cancer  . Heart attack Neg Hx   . Hyperlipidemia Neg Hx   . Sudden death Neg Hx     Allergies  Allergen Reactions  . Shellfish Allergy Shortness Of Breath    CLOSES THROAT    Current Outpatient Prescriptions on File Prior to Visit  Medication Sig Dispense Refill  . fish oil-omega-3 fatty acids 1000 MG capsule Take 2 g by mouth daily.    . fluticasone (FLONASE) 50 MCG/ACT nasal spray Place 2 sprays into both nostrils daily as  needed. 16 g 2  . Multiple Vitamin (MULTIVITAMIN) tablet Take 1 tablet by mouth daily.    . potassium chloride (K-DUR) 10 MEQ tablet Take 1 tablet (10 mEq total) by mouth daily. 90 tablet 3   No current facility-administered medications on file prior to visit.    BP 118/90 mmHg  Pulse 86  Temp(Src) 98.1 F (36.7 C) (Oral)  Ht 5\' 6"  (1.676 m)  Wt 177 lb (80.287 kg)  BMI 28.58 kg/m2    Objective:   Physical Exam  Constitutional: She is oriented to person, place, and time. She appears well-developed and well-nourished. No distress.  HENT:  Head: Normocephalic and atraumatic.  Right Ear: External ear normal.  Left Ear: External ear normal.  Mouth/Throat: Oropharynx is clear and moist.  Eyes: Conjunctivae and EOM are normal. Pupils are equal, round, and reactive to light.  Neck: Neck supple.  Cardiovascular: Normal rate, normal heart sounds and intact distal pulses.   No murmur heard. Pulmonary/Chest: Effort normal and breath sounds normal. She has no wheezes.  Abdominal: Soft. Bowel sounds are normal. She exhibits no distension and no mass. There is no tenderness.  Musculoskeletal: Normal range of motion. She exhibits no edema.  Lymphadenopathy:    She has no cervical adenopathy.  Neurological: She is alert and oriented to person, place, and time. No cranial nerve deficit.  Skin: Skin is warm and dry.  Psychiatric: She has a normal mood and affect. Her behavior is normal.          Assessment & Plan:

## 2014-07-23 NOTE — Assessment & Plan Note (Addendum)
Switch from hydrochlorothiazide 25 mg to losartan 25 mg and hydrochlorothiazide 12.5 mg. She has had issues with lower extremity swelling in the past. If edema worsens, we discussed resuming hydrochlorothiazide 25 mg. No evidence of microalbuminuria.  BP: 118/90 mmHg   Patient's 10 year cardiovascular risk calculated at 7.2%. She declines start of statin medication for now.

## 2014-07-23 NOTE — Assessment & Plan Note (Signed)
She is diet controlled. Lab Results  Component Value Date   HGBA1C 6.6* 07/16/2014   HGBA1C 6.7* 05/12/2013   HGBA1C 6.1* 06/16/2012   Lab Results  Component Value Date   MICROALBUR 1.7 07/16/2014   LDLCALC 94 07/16/2014   CREATININE 0.9 07/16/2014

## 2014-07-30 ENCOUNTER — Telehealth: Payer: Self-pay | Admitting: Internal Medicine

## 2014-07-30 MED ORDER — HYDROCHLOROTHIAZIDE 25 MG PO TABS: 25.0000 mg | ORAL_TABLET | Freq: Every day | ORAL | Status: DC

## 2014-07-30 NOTE — Telephone Encounter (Signed)
Call pt - stop cozaar.  rx for hctz 25 mg sent to her pharm

## 2014-07-30 NOTE — Telephone Encounter (Signed)
Pt verbalized understanding and had no questions 

## 2014-07-30 NOTE — Telephone Encounter (Signed)
Patient Information:  Caller Name: Linda Christian  Phone: [phone]  Patient: Linda Christian, Linda Christian  Gender: Female  DOB: 10-07-1960  Age: 53 Years  PCP: Shawna [facility]Hyun [facility] [place]) (Adults only)  Pregnant: No  Office Follow Up:  Does the office need to follow up with this patient?: Yes  Instructions For The Office: Swelling with new blood pressure medicine  RN Note:  Patient calling regarding recent change in blood pressure medication. HCTZ changed to 12.5 QD and Cozaar 25 mg added.  c/o increase in weight 4 lbs. feels sluggish and c/o fatigue.  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 Unsure of blood pressure. Desires a call back.  Symptoms  Reason For Call & Symptoms: wt gain since change in medication  Reviewed Health History In EMR: Yes  Reviewed Medications In EMR: Yes  Reviewed Allergies In EMR: Yes  Reviewed Surgeries / Procedures: Yes  Date of Onset of Symptoms: 07/26/2014 OB / GYN:  LMP: Unknown  Guideline(s) Used:  High Blood Pressure  Disposition Per Guideline:   Discuss with PCP and Callback by Nurse Today  Reason For Disposition Reached:   Taking BP medications and feels is having side effects (e.g., impotence, cough, dizziness)  Advice Given:  Call Back If:  You have more questions.  Patient Will Follow Care Advice:  YES

## 2014-08-09 ENCOUNTER — Other Ambulatory Visit: Payer: Self-pay | Admitting: Internal Medicine

## 2014-08-21 ENCOUNTER — Telehealth: Payer: Self-pay | Admitting: Internal Medicine

## 2014-08-21 NOTE — Telephone Encounter (Signed)
Pt would like to tried metformin 250 mg once a day #30 call into [address] [facility] [address]

## 2014-08-27 ENCOUNTER — Encounter (HOSPITAL_COMMUNITY): Payer: Self-pay

## 2014-08-27 ENCOUNTER — Emergency Department (HOSPITAL_COMMUNITY)
Admission: EM | Admit: 2014-08-27 | Discharge: 2014-08-27 | Disposition: A | Discharge status: Home / Self Care | Payer: BC Managed Care – PPO | Attending: Emergency Medicine | Admitting: Emergency Medicine

## 2014-08-27 DIAGNOSIS — M25512 Pain in left shoulder: Secondary | ICD-10-CM | POA: Diagnosis present

## 2014-08-27 DIAGNOSIS — Z79899 Other long term (current) drug therapy: Secondary | ICD-10-CM | POA: Insufficient documentation

## 2014-08-27 DIAGNOSIS — M7552 Bursitis of left shoulder: Secondary | ICD-10-CM | POA: Diagnosis not present

## 2014-08-27 DIAGNOSIS — Z87891 Personal history of nicotine dependence: Secondary | ICD-10-CM | POA: Diagnosis not present

## 2014-08-27 DIAGNOSIS — Z8709 Personal history of other diseases of the respiratory system: Secondary | ICD-10-CM | POA: Insufficient documentation

## 2014-08-27 DIAGNOSIS — Z8742 Personal history of other diseases of the female genital tract: Secondary | ICD-10-CM | POA: Diagnosis not present

## 2014-08-27 DIAGNOSIS — Z7951 Long term (current) use of inhaled steroids: Secondary | ICD-10-CM | POA: Insufficient documentation

## 2014-08-27 DIAGNOSIS — Z86010 Personal history of colonic polyps: Secondary | ICD-10-CM | POA: Insufficient documentation

## 2014-08-27 DIAGNOSIS — Z872 Personal history of diseases of the skin and subcutaneous tissue: Secondary | ICD-10-CM | POA: Insufficient documentation

## 2014-08-27 DIAGNOSIS — Z8639 Personal history of other endocrine, nutritional and metabolic disease: Secondary | ICD-10-CM | POA: Insufficient documentation

## 2014-08-27 DIAGNOSIS — I10 Essential (primary) hypertension: Secondary | ICD-10-CM | POA: Insufficient documentation

## 2014-08-27 DIAGNOSIS — Z8739 Personal history of other diseases of the musculoskeletal system and connective tissue: Secondary | ICD-10-CM | POA: Insufficient documentation

## 2014-08-27 MED ORDER — OXYCODONE-ACETAMINOPHEN 5-325 MG PO TABS
1.0000 | ORAL_TABLET | Freq: Once | ORAL | Status: AC
  Administered 2014-08-27: 1 via ORAL
  Filled 2014-08-27: qty 1

## 2014-08-27 MED ORDER — IBUPROFEN 800 MG PO TABS: 800.0000 mg | ORAL_TABLET | Freq: Three times a day (TID) | ORAL | Status: DC

## 2014-08-27 MED ORDER — OXYCODONE-ACETAMINOPHEN 5-325 MG PO TABS: 1.0000 | ORAL_TABLET | ORAL | Status: DC | PRN

## 2014-08-27 MED ORDER — IBUPROFEN 800 MG PO TABS
800.0000 mg | ORAL_TABLET | Freq: Once | ORAL | Status: AC
  Administered 2014-08-27: 800 mg via ORAL
  Filled 2014-08-27: qty 1

## 2014-08-27 NOTE — Discharge Instructions (Signed)
Bursitis Bursitis is a swelling and soreness (inflammation) of a fluid-filled sac (bursa) that overlies and protects a joint. It can be caused by injury, overuse of the joint, arthritis or infection. The joints most likely to be affected are the elbows, shoulders, hips and knees. HOME CARE INSTRUCTIONS   Apply ice to the affected area for 15-20 minutes each hour while awake for 2 days. Put the ice in a plastic bag and place a towel between the bag of ice and your skin.  Rest the injured joint as much as possible, but continue to put the joint through a full range of motion, 4 times per day. (The shoulder joint especially becomes rapidly "frozen" if not used.) When the pain lessens, begin normal slow movements and usual activities.  Only take over-the-counter or prescription medicines for pain, discomfort or fever as directed by your caregiver.  Your caregiver may recommend draining the bursa and injecting medicine into the bursa. This may help the healing process.  Follow all instructions for follow-up with your caregiver. This includes any orthopedic referrals, physical therapy and rehabilitation. Any delay in obtaining necessary care could result in a delay or failure of the bursitis to heal and chronic pain. SEEK IMMEDIATE MEDICAL CARE IF:   Your pain increases even during treatment.  You develop an oral temperature above 102 F (38.9 C) and have heat and inflammation over the involved bursa. MAKE SURE YOU:   Understand these instructions.  Will watch your condition.  Will get help right away if you are not doing well or get worse. Document Released: 08/14/2000 Document Revised: 11/09/2011 Document Reviewed: 11/06/2013 [facility] Patient Information 2015 [place], [place]. This information is not intended to replace advice given to you by your health care provider. Make sure you discuss any questions you have with your health care provider. Cryotherapy Cryotherapy means treatment with  cold. Ice or gel packs can be used to reduce both pain and swelling. Ice is the most helpful within the first 24 to 48 hours after an injury or flare-up from overusing a muscle or joint. Sprains, strains, spasms, burning pain, shooting pain, and aches can all be eased with ice. Ice can also be used when recovering from surgery. Ice is effective, has very few side effects, and is safe for most people to use. PRECAUTIONS  Ice is not a safe treatment option for people with:  Raynaud phenomenon. This is a condition affecting small blood vessels in the extremities. Exposure to cold may cause your problems to return.  Cold hypersensitivity. There are many forms of cold hypersensitivity, including:  Cold urticaria. Red, itchy hives appear on the skin when the tissues begin to warm after being iced.  Cold erythema. This is a red, itchy rash caused by exposure to cold.  Cold hemoglobinuria. Red blood cells break down when the tissues begin to warm after being iced. The hemoglobin that carry oxygen are passed into the urine because they cannot combine with blood proteins fast enough.  Numbness or altered sensitivity in the area being iced. If you have any of the following conditions, do not use ice until you have discussed cryotherapy with your caregiver:  Heart conditions, such as arrhythmia, angina, or chronic heart disease.  High blood pressure.  Healing wounds or open skin in the area being iced.  Current infections.  Rheumatoid arthritis.  Poor circulation.  Diabetes. Ice slows the blood flow in the region it is applied. This is beneficial when trying to stop inflamed tissues from spreading  irritating chemicals to surrounding tissues. However, if you expose your skin to cold temperatures for too long or without the proper protection, you can damage your skin or nerves. Watch for signs of skin damage due to cold. HOME CARE INSTRUCTIONS Follow these tips to use ice and cold packs  safely.  Place a dry or damp towel between the ice and skin. A damp towel will cool the skin more quickly, so you may need to shorten the time that the ice is used.  For a more rapid response, add gentle compression to the ice.  Ice for no more than 10 to 20 minutes at a time. The bonier the area you are icing, the less time it will take to get the benefits of ice.  Check your skin after 5 minutes to make sure there are no signs of a poor response to cold or skin damage.  Rest 20 minutes or more between uses.  Once your skin is numb, you can end your treatment. You can test numbness by very lightly touching your skin. The touch should be so light that you do not see the skin dimple from the pressure of your fingertip. When using ice, most people will feel these normal sensations in this order: cold, burning, aching, and numbness.  Do not use ice on someone who cannot communicate their responses to pain, such as small children or people with dementia. HOW TO MAKE AN ICE PACK Ice packs are the most common way to use ice therapy. Other methods include ice massage, ice baths, and cryosprays. Muscle creams that cause a cold, tingly feeling do not offer the same benefits that ice offers and should not be used as a substitute unless recommended by your caregiver. To make an ice pack, do one of the following:  Place crushed ice or a bag of frozen vegetables in a sealable plastic bag. Squeeze out the excess air. Place this bag inside another plastic bag. Slide the bag into a pillowcase or place a damp towel between your skin and the bag.  Mix 3 parts water with 1 part rubbing alcohol. Freeze the mixture in a sealable plastic bag. When you remove the mixture from the freezer, it will be slushy. Squeeze out the excess air. Place this bag inside another plastic bag. Slide the bag into a pillowcase or place a damp towel between your skin and the bag. SEEK MEDICAL CARE IF:  You develop white spots on your  skin. This may give the skin a blotchy (mottled) appearance.  Your skin turns blue or pale.  Your skin becomes waxy or hard.  Your swelling gets worse. MAKE SURE YOU:   Understand these instructions.  Will watch your condition.  Will get help right away if you are not doing well or get worse. Document Released: 04/13/2011 Document Revised: 01/01/2014 Document Reviewed: 04/13/2011 [facility] Patient Information 2015 [place], [place]. This information is not intended to replace advice given to you by your health care provider. Make sure you discuss any questions you have with your health care provider.

## 2014-08-27 NOTE — ED Provider Notes (Signed)
CSN: [id number]     Arrival date & time 08/27/14  1716 History  This chart was scribed for non-physician practitioner, Charlann Lange, PA-C, working with Evelina Bucy, MD by Randa Evens, ED Scribe. This patient was seen in room WTR5/WTR5 and the patient's care was started at 8:56 PM.     Chief Complaint  Patient presents with  . Shoulder Pain   Patient is a 53 y.o. female presenting with shoulder pain. The history is provided by the patient. No language interpreter was used.  Shoulder Pain  HPI Comments: Linda Christian is a 53 y.o. female who presents to the Emergency Department complaining of new throbbing aching left shoulder pain onset 1 week prior. Pt states that he recently had his neck adjusted by a chiropractor and has since been having left shoulder pain. Pt states that the pain is slightly radiating into her left neck. Pt states that she is not able to lift her shoulder due to the pain. Pt states she has taken prednisone with no relief. Pt doesn't report any other symptoms.    Past Medical History  Diagnosis Date  . Allergic rhinitis   . Hypertension   . Hx of colonic polyp     hyperplastic 4/08  . Glucose intolerance (impaired glucose tolerance)     diabetes type II  . Hyperlipidemia   . Menometrorrhagia   . Nipple discharge 09/2011    left  . Hypertension     under control; has been on med. x 8-9 yrs.  . Seasonal allergies   . 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 Eczema     perineal area  . Irregular periods/menstrual cycles    Past Surgical History  Procedure Laterality Date  . Colonoscopy w/ polypectomy    . Hemorrhoid surgery      hemorhoidectomy  . Hemorrhoid surgery  04/08/2007  . Breast surgery  10/02/2011  . Breast ductal system excision  10/02/2011    Procedure: EXCISION DUCTAL SYSTEM BREAST;  Surgeon: Imogene Burn. Georgette Dover, MD;  Location: [facility];  Service: General;  Laterality: Left;  left nipple duct  excision   Family History  Problem Relation Age of Onset  . Other      no FH of colon cancer  . Hypertension Mother   . Diabetes Mother     type II  . Hypertension Father   . Diabetes Father     type II  . Anesthesia problems Father     renal function slow after anesthesia  . Cancer Neg Hx     negative for colon cancer  . Heart attack Neg Hx   . Hyperlipidemia Neg Hx   . Sudden death Neg Hx    History  Substance Use Topics  . Smoking status: Former Research scientist (life sciences)  . Smokeless tobacco: Never Used     Comment: quit smoking 1982  . Alcohol Use: No   OB History    No data available     Review of Systems  Musculoskeletal: Positive for arthralgias. Negative for joint swelling.  All other systems reviewed and are negative.   Allergies  Shellfish allergy  Home Medications   Prior to Admission medications   Medication Sig Start Date End Date Taking? Authorizing Provider  fish oil-omega-3 fatty acids 1000 MG capsule Take 2 g by mouth daily.    Historical Provider, MD  fluticasone (FLONASE) 50 MCG/ACT nasal spray Place 2 sprays into both nostrils daily  as needed. 04/16/14 08/08/17  Brunetta Jeans, PA-C  hydrochlorothiazide (HYDRODIURIL) 25 MG tablet TAKE 1 TABLET DAILY (NEED OFFICE VISIT) 08/09/14   Doe-Hyun Kyra Searles, DO  Multiple Vitamin (MULTIVITAMIN) tablet Take 1 tablet by mouth daily.    Historical Provider, MD  potassium chloride (K-DUR) 10 MEQ tablet Take 1 tablet (10 mEq total) by mouth daily. 05/16/13   Doe-Hyun Kyra Searles, DO   Triage Vitals: BP 143/80 mmHg  Pulse 97  Temp(Src) 98.3 F (36.8 C) (Oral)  Resp 18  SpO2 99%  LMP 01/25/2014  Physical Exam  Constitutional: She is oriented to person, place, and time. She appears well-developed and well-nourished. No distress.  HENT:  Head: Normocephalic and atraumatic.  Eyes: Conjunctivae and EOM are normal.  Neck: Neck supple. No tracheal deviation present.  Cardiovascular: Normal rate.   Pulmonary/Chest: Effort normal. No  respiratory distress. She exhibits no tenderness.  Musculoskeletal: Normal range of motion. She exhibits tenderness.  Significantly tender to deltoid region of left shoulder, no swelling or discoloration, limited ROM, no midline or paracervical tenderness, distal grip 5/5.   Neurological: She is alert and oriented to person, place, and time.  Skin: Skin is warm and dry.  Psychiatric: She has a normal mood and affect. Her behavior is normal.  Nursing note and vitals reviewed.   ED Course  Procedures (including critical care time) DIAGNOSTIC STUDIES: Oxygen Saturation is 99% on RA, normal by my interpretation.    COORDINATION OF CARE: 9:09 PM-Discussed treatment plan with pt at bedside and pt agreed to plan.     Labs Review Labs Reviewed - No data to display  Imaging Review No results found.   EKG Interpretation None      MDM   Final diagnoses:  None   1. Left shoulder bursitis  No concern for radicular pain. Tender to deltoid shoulder with increased pain with ROM. No strength deficits. Suspect strain or bursitis of shoulder.   I personally performed the services described in this documentation, which was scribed in my presence. The recorded information has been reviewed and is accurate.       Dewaine Oats, PA-C 09/01/14 0035  Evelina Bucy, MD 09/02/14 [phone]

## 2014-08-27 NOTE — ED Notes (Signed)
Pt had neck adjusted at chiropractor last week.  Now having left shoulder pain since then.  No pain relief from meds.

## 2014-09-04 MED ORDER — METFORMIN HCL 500 MG PO TABS: 250.0000 mg | ORAL_TABLET | Freq: Two times a day (BID) | ORAL | Status: DC

## 2014-09-04 NOTE — Telephone Encounter (Signed)
Metformin does not come in 250 mg.  She can cut 500 mg in half and take twice daily.  #30.  RF x3

## 2014-09-04 NOTE — Telephone Encounter (Signed)
rx sent in electronically, pt aware 

## 2014-10-01 LAB — HM DIABETES EYE EXAM

## 2014-10-08 ENCOUNTER — Encounter: Payer: Self-pay | Admitting: Internal Medicine

## 2014-11-21 ENCOUNTER — Ambulatory Visit: Payer: Self-pay | Admitting: Internal Medicine

## 2014-12-03 ENCOUNTER — Encounter: Payer: Self-pay | Admitting: Internal Medicine

## 2015-01-21 ENCOUNTER — Other Ambulatory Visit: Payer: Self-pay | Admitting: Internal Medicine

## 2015-04-19 ENCOUNTER — Other Ambulatory Visit: Payer: Self-pay | Admitting: Internal Medicine

## 2015-07-19 ENCOUNTER — Other Ambulatory Visit: Payer: Self-pay | Admitting: Internal Medicine

## 2015-10-15 ENCOUNTER — Other Ambulatory Visit: Payer: Self-pay | Admitting: Internal Medicine

## 2016-03-10 ENCOUNTER — Encounter: Payer: Self-pay | Admitting: Family Medicine

## 2016-03-13 ENCOUNTER — Encounter: Payer: Self-pay | Admitting: Gastroenterology

## 2016-04-24 ENCOUNTER — Ambulatory Visit (AMBULATORY_SURGERY_CENTER): Payer: Self-pay

## 2016-04-24 VITALS — Ht 66.0 in | Wt 178.6 lb

## 2016-04-24 DIAGNOSIS — Z860100 Personal history of colon polyps, unspecified: Secondary | ICD-10-CM

## 2016-04-24 MED ORDER — SUPREP BOWEL PREP KIT 17.5-3.13-1.6 GM/177ML PO SOLN: 1.0000 | Freq: Once | ORAL | 0 refills | Status: AC

## 2016-04-24 NOTE — Progress Notes (Signed)
Declined emmi  No allergies to eggs or soy No past problems with anesthesia No home oxygen No diet meds

## 2016-05-08 ENCOUNTER — Encounter: Payer: Self-pay | Admitting: Gastroenterology

## 2016-05-08 ENCOUNTER — Ambulatory Visit (AMBULATORY_SURGERY_CENTER): Payer: BLUE CROSS/BLUE SHIELD | Admitting: Gastroenterology

## 2016-05-08 VITALS — BP 101/68 | HR 78 | Temp 98.2°F | Resp 16 | Ht 66.0 in | Wt 178.0 lb

## 2016-05-08 DIAGNOSIS — Z86010 Personal history of colonic polyps: Secondary | ICD-10-CM

## 2016-05-08 DIAGNOSIS — Z1211 Encounter for screening for malignant neoplasm of colon: Secondary | ICD-10-CM | POA: Diagnosis present

## 2016-05-08 MED ORDER — SODIUM CHLORIDE 0.9 % IV SOLN: 500.0000 mL | INTRAVENOUS | Status: AC

## 2016-05-08 NOTE — Progress Notes (Signed)
A and O x3. Report to RN. Tolerated MAC anesthesia well. 

## 2016-05-08 NOTE — Op Note (Signed)
[facility] Patient Name: Linda Christian Procedure Date: 05/08/2016 1:11 PM MRN: [id number] Endoscopist: Mallie Mussel L. Loletha Carrow , MD Age: 55 Referring MD:  Date of Birth: 07-27-1961 Gender: Female Account #: [id number] Procedure:                Colonoscopy Indications:              Screening for colorectal malignant neoplasm (the                            patient had a prior diagnostic colonoscopy for                            constipation in 2008) Medicines:                Monitored Anesthesia Care Procedure:                Pre-Anesthesia Assessment:                           - Prior to the procedure, a History and Physical                            was performed, and patient medications and                            allergies were reviewed. The patient's tolerance of                            previous anesthesia was also reviewed. The risks                            and benefits of the procedure and the sedation                            options and risks were discussed with the patient.                            All questions were answered, and informed consent                            was obtained. Prior Anticoagulants: The patient has                            taken no previous anticoagulant or antiplatelet                            agents. ASA Grade Assessment: II - A patient with                            mild systemic disease. After reviewing the risks                            and benefits, the patient was deemed in  satisfactory condition to undergo the procedure.                           After obtaining informed consent, the colonoscope                            was passed under direct vision. Throughout the                            procedure, the patient's blood pressure, pulse, and                            oxygen saturations were monitored continuously. The                            Model CF-HQ190L [id number]) scope was  introduced                            through the anus and advanced to the the cecum,                            identified by appendiceal orifice and ileocecal                            valve. The colonoscopy was performed without                            difficulty. The patient tolerated the procedure                            well. The quality of the bowel preparation was                            good. The ileocecal valve, appendiceal orifice, and                            rectum were photographed. The quality of the bowel                            preparation was evaluated using the BBPS [facility]                            Bowel Preparation Scale) with scores of: Right                            Colon = 2, Transverse Colon = 2 and Left Colon = 3.                            The total BBPS score equals 7. The bowel                            preparation used was SUPREP. Scope In: 1:18:43 PM Scope Out: 1:29:37 PM Scope Withdrawal Time: 0 hours 6 minutes  50 seconds  Total Procedure Duration: 0 hours 10 minutes 54 seconds  Findings:                 The perianal and digital rectal examinations were                            normal.                           Internal hemorrhoids were found during                            retroflexion. The hemorrhoids were small and Grade                            I (internal hemorrhoids that do not prolapse).                           The exam was otherwise without abnormality. Complications:            No immediate complications. Estimated Blood Loss:     Estimated blood loss: none. Impression:               - Internal hemorrhoids.                           - The examination was otherwise normal.                           - No specimens collected. Recommendation:           - Patient has a contact number available for                            emergencies. The signs and symptoms of potential                            delayed complications were  discussed with the                            patient. Return to normal activities tomorrow.                            Written discharge instructions were provided to the                            patient.                           - Resume previous diet.                           - Continue present medications.                           - Repeat colonoscopy in 10 years for screening  purposes. Henry L. Loletha Carrow, MD 05/08/2016 1:36:09 PM This report has been signed electronically.

## 2016-05-08 NOTE — Patient Instructions (Signed)
YOU HAD AN ENDOSCOPIC PROCEDURE TODAY AT THE [facility]:   Refer to the procedure report that was given to you for any specific questions about what was found during the examination.  If the procedure report does not answer your questions, please call your gastroenterologist to clarify.  If you requested that your care partner not be given the details of your procedure findings, then the procedure report has been included in a sealed envelope for you to review at your convenience later.  YOU SHOULD EXPECT: Some feelings of bloating in the abdomen. Passage of more gas than usual.  Walking can help get rid of the air that was put into your GI tract during the procedure and reduce the bloating. If you had a lower endoscopy (such as a colonoscopy or flexible sigmoidoscopy) you may notice spotting of blood in your stool or on the toilet paper. If you underwent a bowel prep for your procedure, you may not have a normal bowel movement for a few days.  Please Note:  You might notice some irritation and congestion in your nose or some drainage.  This is from the oxygen used during your procedure.  There is no need for concern and it should clear up in a day or so.  SYMPTOMS TO REPORT IMMEDIATELY:   Following lower endoscopy (colonoscopy or flexible sigmoidoscopy):  Excessive amounts of blood in the stool  Significant tenderness or worsening of abdominal pains  Swelling of the abdomen that is new, acute  Fever of 100F or higher  For urgent or emergent issues, a gastroenterologist can be reached at any hour by calling [phone].   DIET:  We do recommend a small meal at first, but then you may proceed to your regular diet.  Drink plenty of fluids but you should avoid alcoholic beverages for 24 hours.  ACTIVITY:  You should plan to take it easy for the rest of today and you should NOT DRIVE or use heavy machinery until tomorrow (because of the sedation medicines used during the test).     FOLLOW UP: Our staff will call the number listed on your records the next business day following your procedure to check on you and address any questions or concerns that you may have regarding the information given to you following your procedure. If we do not reach you, we will leave a message.  However, if you are feeling well and you are not experiencing any problems, there is no need to return our call.  We will assume that you have returned to your regular daily activities without incident.  If any biopsies were taken you will be contacted by phone or by letter within the next 1-3 weeks.  Please call us at [phone] if you have not heard about the biopsies in 3 weeks.    SIGNATURES/CONFIDENTIALITY: You and/or your care partner have signed paperwork which will be entered into your electronic medical record.  These signatures attest to the fact that that the information above on your After Visit Summary has been reviewed and is understood.  Full responsibility of the confidentiality of this discharge information lies with you and/or your care-partner. 

## 2016-05-11 ENCOUNTER — Telehealth: Payer: Self-pay | Admitting: *Deleted

## 2016-05-11 NOTE — Telephone Encounter (Signed)
  Follow up Call-  Call back number 05/08/2016  Post procedure Call Back phone  # [phone]  Permission to leave phone message Yes  Some recent data might be hidden     Patient questions:  Do you have a fever, pain , or abdominal swelling? No. Pain Score  0 *  Have you tolerated food without any problems? Yes.    Have you been able to return to your normal activities? Yes.    Do you have any questions about your discharge instructions: Diet   No. Medications  No. Follow up visit  No.  Do you have questions or concerns about your Care? No.  Actions: * If pain score is 4 or above: No action needed, pain <4.

## 2016-07-28 ENCOUNTER — Emergency Department (HOSPITAL_COMMUNITY)
Admission: EM | Admit: 2016-07-28 | Discharge: 2016-07-28 | Disposition: A | Discharge status: Home / Self Care | Payer: BLUE CROSS/BLUE SHIELD | Attending: Emergency Medicine | Admitting: Emergency Medicine

## 2016-07-28 ENCOUNTER — Emergency Department (HOSPITAL_COMMUNITY): Payer: BLUE CROSS/BLUE SHIELD

## 2016-07-28 ENCOUNTER — Encounter (HOSPITAL_COMMUNITY): Payer: Self-pay | Admitting: *Deleted

## 2016-07-28 DIAGNOSIS — R002 Palpitations: Secondary | ICD-10-CM | POA: Insufficient documentation

## 2016-07-28 DIAGNOSIS — E876 Hypokalemia: Secondary | ICD-10-CM | POA: Diagnosis not present

## 2016-07-28 DIAGNOSIS — I10 Essential (primary) hypertension: Secondary | ICD-10-CM | POA: Diagnosis not present

## 2016-07-28 DIAGNOSIS — E119 Type 2 diabetes mellitus without complications: Secondary | ICD-10-CM | POA: Diagnosis not present

## 2016-07-28 DIAGNOSIS — Z87891 Personal history of nicotine dependence: Secondary | ICD-10-CM | POA: Diagnosis not present

## 2016-07-28 LAB — BASIC METABOLIC PANEL
Anion gap: 10 (ref 5–15)
BUN: 13 mg/dL (ref 6–20)
CALCIUM: 10.2 mg/dL (ref 8.9–10.3)
CO2: 30 mmol/L (ref 22–32)
CREATININE: 0.97 mg/dL (ref 0.44–1.00)
Chloride: 98 mmol/L — ABNORMAL LOW (ref 101–111)
Glucose, Bld: 109 mg/dL — ABNORMAL HIGH (ref 65–99)
Potassium: 3 mmol/L — ABNORMAL LOW (ref 3.5–5.1)
SODIUM: 138 mmol/L (ref 135–145)

## 2016-07-28 LAB — CBC
HCT: 40.9 % (ref 36.0–46.0)
Hemoglobin: 13.6 g/dL (ref 12.0–15.0)
MCH: 29.1 pg (ref 26.0–34.0)
MCHC: 33.3 g/dL (ref 30.0–36.0)
MCV: 87.6 fL (ref 78.0–100.0)
PLATELETS: 204 10*3/uL (ref 150–400)
RBC: 4.67 MIL/uL (ref 3.87–5.11)
RDW: 12.8 % (ref 11.5–15.5)
WBC: 7.2 10*3/uL (ref 4.0–10.5)

## 2016-07-28 LAB — I-STAT TROPONIN, ED
TROPONIN I, POC: 0 ng/mL (ref 0.00–0.08)
TROPONIN I, POC: 0 ng/mL (ref 0.00–0.08)

## 2016-07-28 LAB — TSH: TSH: 0.604 u[IU]/mL (ref 0.350–4.500)

## 2016-07-28 MED ORDER — POTASSIUM CHLORIDE CRYS ER 20 MEQ PO TBCR: 20.0000 meq | EXTENDED_RELEASE_TABLET | Freq: Every day | ORAL | 0 refills | Status: DC

## 2016-07-28 MED ORDER — POTASSIUM CHLORIDE CRYS ER 20 MEQ PO TBCR
40.0000 meq | EXTENDED_RELEASE_TABLET | Freq: Once | ORAL | Status: AC
  Administered 2016-07-28: 40 meq via ORAL
  Filled 2016-07-28: qty 2

## 2016-07-28 NOTE — ED Triage Notes (Signed)
Pt complains of heart palpitations, pressure in chest since last night. Pt denies shortness of breath. Pt states she has been under increased stress lately.

## 2016-07-28 NOTE — ED Provider Notes (Addendum)
[facility] DEPT Provider Note   CSN: [id number] Arrival date & time: 07/28/16  1529     History   Chief Complaint Chief Complaint  Patient presents with  . Chest Pain    HPI Linda Christian is a 55 y.o. female.  HPI   55 year old female with hx of HTN, DM, heart murmur here with chest palpitation.  Last night pt report having heart palpitation,without any associated pain. Sxs lasted 35-45 min and improves.  She does report mild lightheadedness, headache and fatigue. This AM she noticed heart palpitation, which has improved.  Denies fever, chills, n/v/d, constipation, SOB, chest pain, abd pain, claudication. Report feeling stressed out at work and also worry about her kids.  Patient mentioned she does have a PCP, and has been seen by a cardiologist a year and half ago for a "complete checkup". She did not have a cardiac stress test but states she had an echocardiogram and was told that she does not have any significant concerning finding. Aside from drinking a cup of coffee daily she denies any other caffeine use. No recent medication changes except for Januvia that was started a month ago since patient unable to take her metformin. She is not a smoker or drinker and denies any recreational drug use. Denies any history of thyroid disease. Currently the symptom has resolved.     Past Medical History:  Diagnosis Date  . Allergic rhinitis   . Arthritis    cervical spine; difficulty turning head from side to side  . Eczema    perineal area  . Glucose intolerance (impaired glucose tolerance)    diabetes type II  . Hx of colonic polyp    hyperplastic 4/08  . Hyperlipidemia   . Hypertension   . Hypertension    under control; has been on med. x 8-9 yrs.  . Irregular periods/menstrual cycles   . Menometrorrhagia   . Nipple discharge 09/2011   left  . Seasonal allergies     Patient Active Problem List   Diagnosis Date Noted  . Eustachian tube dysfunction 04/17/2014  .  Sinusitis 11/29/2013  . Preventative health care 06/25/2012  . Menorrhagia 11/19/2011  . Anemia 11/19/2011  . Neck pain 10/26/2011  . Bloody discharge from nipple - central left nipple at 9:00 09/15/2011  . Abnormal liver function 08/10/2011  . COLONIC POLYPS, HYPERPLASTIC, HX OF 01/13/2010  . METRORRHAGIA 01/02/2008  . HYPOKALEMIA 09/02/2007  . HYPERLIPIDEMIA 08/30/2007  . Diabetes mellitus type 2, controlled ([place]) 08/05/2007  . Essential hypertension 03/23/2007  . ALLERGIC RHINITIS 03/23/2007  . HEART MURMUR, HX OF 03/23/2007    Past Surgical History:  Procedure Laterality Date  . BREAST DUCTAL SYSTEM EXCISION  10/02/2011   Procedure: EXCISION DUCTAL SYSTEM BREAST;  Surgeon: Imogene Burn. Georgette Dover, MD;  Location: [facility];  Service: General;  Laterality: Left;  left nipple duct excision  . BREAST SURGERY  10/02/2011  . COLONOSCOPY W/ POLYPECTOMY    . HEMORRHOID SURGERY     hemorhoidectomy  . HEMORRHOID SURGERY  04/08/2007    OB History    No data available       Home Medications    Prior to Admission medications   Medication Sig Start Date End Date Taking? Authorizing Provider  fish oil-omega-3 fatty acids 1000 MG capsule Take 2 g by mouth daily.    Historical Provider, MD  fluticasone (FLONASE) 50 MCG/ACT nasal spray Place 2 sprays into both nostrils daily as needed. 04/16/14 08/08/17  Gwyndolyn Saxon  Daun Peacock, PA-C  hydrochlorothiazide (HYDRODIURIL) 25 MG tablet TAKE 1 TABLET DAILY (NEED OFFICE VISIT FOR MORE REFILLS ) 07/19/15   Doe-Hyun R Shawna Orleans, DO  ibuprofen (ADVIL,MOTRIN) 800 MG tablet Take 1 tablet (800 mg total) by mouth 3 (three) times daily. 08/27/14   Charlann Lange, PA-C  Multiple Vitamin (MULTIVITAMIN) tablet Take 1 tablet by mouth daily.    Historical Provider, MD    Family History Family History  Problem Relation Age of Onset  . Hypertension Mother   . Diabetes Mother     type II  . Hypertension Father   . Diabetes Father     type II  . Anesthesia  problems Father     renal function slow after anesthesia  . Other      no FH of colon cancer  . Cancer Neg Hx     negative for colon cancer  . Heart attack Neg Hx   . Hyperlipidemia Neg Hx   . Sudden death Neg Hx   . Colon cancer Neg Hx     Social History Social History  Substance Use Topics  . Smoking status: Former Research scientist (life sciences)  . Smokeless tobacco: Never Used     Comment: quit smoking 1982  . Alcohol use No     Allergies   Shellfish allergy   Review of Systems Review of Systems  All other systems reviewed and are negative.    Physical Exam Updated Vital Signs BP 145/77 (BP Location: Left Arm)   Pulse 74   Temp 97.9 F (36.6 C) (Oral)   Resp 18   SpO2 100%   Physical Exam  Constitutional: She appears well-developed and well-nourished. No distress.  HENT:  Head: Atraumatic.  Eyes: Conjunctivae are normal.  Neck: Normal range of motion. Neck supple. No thyromegaly present.  Cardiovascular: Normal rate, regular rhythm and intact distal pulses.   No murmur heard. Pulmonary/Chest: Effort normal and breath sounds normal.  Abdominal: Soft. Bowel sounds are normal. There is no tenderness.  Musculoskeletal: She exhibits no edema.  Neurological: She is alert.  Skin: Capillary refill takes less than 2 seconds. No rash noted.  Psychiatric: She has a normal mood and affect.  Nursing note and vitals reviewed.    ED Treatments / Results  Labs (all labs ordered are listed, but only abnormal results are displayed) Labs Reviewed  BASIC METABOLIC PANEL - Abnormal; Notable for the following:       Result Value   Potassium 3.0 (*)    Chloride 98 (*)    Glucose, Bld 109 (*)    All other components within normal limits  CBC  TSH  I-STAT TROPOININ, ED  I-STAT TROPOININ, ED    EKG  EKG Interpretation  Date/Time:  Tuesday July 28 2016 15:36:40 EST Ventricular Rate:  75 PR Interval:    QRS Duration: 92 QT Interval:  393 QTC Calculation: 439 R Axis:   30 Text  Interpretation:  Sinus rhythm Consider left ventricular hypertrophy NO STEMI Confirmed by Karalee Hauter MD, Malike Foglio [phone]) on 07/29/2016 11:44:17 AM     ED ECG REPORT   Date: 07/28/2016  Rate: 75  Rhythm: normal sinus rhythm  QRS Axis: normal  Intervals: normal  ST/T Wave abnormalities: normal  Conduction Disutrbances:U-waves in the inferior leads  Narrative Interpretation:   Old EKG Reviewed: none available  I have personally reviewed the EKG tracing and agree with the computerized printout as noted.   Radiology Dg Chest 2 View  Result Date: 07/28/2016 CLINICAL DATA:  Shortness of  breath and left-sided chest pain began last night. Patient is under significant stress. EXAM: CHEST  2 VIEW COMPARISON:  PA and lateral chest x-ray of April 07, 2007 FINDINGS: The lungs are adequately inflated. There is no focal infiltrate. There is no pleural effusion. The heart and pulmonary vascularity are normal. The mediastinum is normal in width. The bony thorax exhibits no acute abnormality. IMPRESSION: There is no active cardiopulmonary disease. Electronically Signed   By: David  Martinique M.D.   On: 07/28/2016 16:26    Procedures Procedures (including critical care time)  Medications Ordered in ED Medications  potassium chloride SA (K-DUR,KLOR-CON) CR tablet 40 mEq (40 mEq Oral Given 07/28/16 1710)     Initial Impression / Assessment and Plan / ED Course  I have reviewed the triage vital signs and the nursing notes.  Pertinent labs & imaging results that were available during my care of the patient were reviewed by me and considered in my medical decision making (see chart for details).  Clinical Course     BP 127/79 (BP Location: Right Arm)   Pulse 73   Temp 97.7 F (36.5 C) (Oral)   Resp 18   SpO2 100%    Final Clinical Impressions(s) / ED Diagnoses   Final diagnoses:  Heart palpitations  Hypokalemia    New Prescriptions New Prescriptions   POTASSIUM CHLORIDE SA  (K-DUR,KLOR-CON) 20 MEQ TABLET    Take 1 tablet (20 mEq total) by mouth daily.   5:02 PM Patient presents with concerns for occasional heart palpitation. She had to reduce stress. She does not have any active chest pain or shortness of breath. I have low suspicion for ACS or PE. No temperature intolerance, dry hair, or thyromegaly to suggest thyroid disease. She is not taking any stimulants.  Work up initiated.   5:35 PM  K+ is 3.0, EKG with faint U-waves in the inferior leads.  Suspect hypokalemia may increase risk of arrhythmia.  Potassium supplementation given.  Will monitor closely.  Care discussed with DR. Tehani Mersman.   7:38 PM Negative delta troponin. No concerning arrhythmia on EKG.  Will discharge with potassium supplementation and outpt f/u with PCP for further care.  Return precaution given.      Domenic Moras, PA-C 07/28/16 [place], MD 07/29/16 [place], MD 07/29/16 1144

## 2016-07-28 NOTE — Discharge Instructions (Signed)
Your heart palpitation may be due to low potassium.  Please eat a banana daily and take supplementation.  Follow up with your doctor for further care, you may need to have a Holter Monitoring to for further evaluation of your heart palpitation.

## 2016-07-28 NOTE — ED Notes (Signed)
Pt resting comfortably. Family at bedside, Pt denies CP  At this time. V/s WNL

## 2016-08-18 ENCOUNTER — Other Ambulatory Visit: Payer: Self-pay | Admitting: *Deleted

## 2016-08-20 ENCOUNTER — Ambulatory Visit
Admission: RE | Admit: 2016-08-20 | Discharge: 2016-08-20 | Disposition: A | Discharge status: Home / Self Care | Payer: BLUE CROSS/BLUE SHIELD | Source: Ambulatory Visit | Attending: Family Medicine | Admitting: Family Medicine

## 2016-08-20 ENCOUNTER — Other Ambulatory Visit: Payer: Self-pay | Admitting: Family Medicine

## 2016-08-20 DIAGNOSIS — M25519 Pain in unspecified shoulder: Secondary | ICD-10-CM

## 2016-08-20 DIAGNOSIS — G8929 Other chronic pain: Secondary | ICD-10-CM

## 2016-08-20 DIAGNOSIS — M542 Cervicalgia: Principal | ICD-10-CM

## 2017-01-28 ENCOUNTER — Other Ambulatory Visit: Payer: Self-pay | Admitting: Orthopedic Surgery

## 2017-01-28 DIAGNOSIS — M5412 Radiculopathy, cervical region: Secondary | ICD-10-CM

## 2017-10-23 ENCOUNTER — Encounter (HOSPITAL_COMMUNITY): Payer: Self-pay | Admitting: Emergency Medicine

## 2017-10-23 ENCOUNTER — Emergency Department (HOSPITAL_COMMUNITY)
Admission: EM | Admit: 2017-10-23 | Discharge: 2017-10-23 | Disposition: A | Discharge status: Home / Self Care | Payer: BC Managed Care – PPO | Attending: Emergency Medicine | Admitting: Emergency Medicine

## 2017-10-23 ENCOUNTER — Emergency Department (HOSPITAL_COMMUNITY): Payer: BC Managed Care – PPO

## 2017-10-23 DIAGNOSIS — Y9389 Activity, other specified: Secondary | ICD-10-CM | POA: Diagnosis not present

## 2017-10-23 DIAGNOSIS — S060X0A Concussion without loss of consciousness, initial encounter: Secondary | ICD-10-CM | POA: Diagnosis not present

## 2017-10-23 DIAGNOSIS — Y999 Unspecified external cause status: Secondary | ICD-10-CM | POA: Insufficient documentation

## 2017-10-23 DIAGNOSIS — Z79899 Other long term (current) drug therapy: Secondary | ICD-10-CM | POA: Insufficient documentation

## 2017-10-23 DIAGNOSIS — Z7982 Long term (current) use of aspirin: Secondary | ICD-10-CM | POA: Diagnosis not present

## 2017-10-23 DIAGNOSIS — S0990XA Unspecified injury of head, initial encounter: Secondary | ICD-10-CM | POA: Diagnosis present

## 2017-10-23 DIAGNOSIS — I10 Essential (primary) hypertension: Secondary | ICD-10-CM | POA: Insufficient documentation

## 2017-10-23 DIAGNOSIS — Z87891 Personal history of nicotine dependence: Secondary | ICD-10-CM | POA: Diagnosis not present

## 2017-10-23 DIAGNOSIS — W109XXA Fall (on) (from) unspecified stairs and steps, initial encounter: Secondary | ICD-10-CM | POA: Insufficient documentation

## 2017-10-23 DIAGNOSIS — S161XXA Strain of muscle, fascia and tendon at neck level, initial encounter: Secondary | ICD-10-CM | POA: Diagnosis not present

## 2017-10-23 DIAGNOSIS — E119 Type 2 diabetes mellitus without complications: Secondary | ICD-10-CM | POA: Diagnosis not present

## 2017-10-23 DIAGNOSIS — Y929 Unspecified place or not applicable: Secondary | ICD-10-CM | POA: Insufficient documentation

## 2017-10-23 DIAGNOSIS — Z7984 Long term (current) use of oral hypoglycemic drugs: Secondary | ICD-10-CM | POA: Insufficient documentation

## 2017-10-23 HISTORY — DX: Type 2 diabetes mellitus without complications: E11.9

## 2017-10-23 MED ORDER — ACETAMINOPHEN 325 MG PO TABS
650.0000 mg | ORAL_TABLET | Freq: Once | ORAL | Status: AC
  Administered 2017-10-23: 650 mg via ORAL
  Filled 2017-10-23: qty 2

## 2017-10-23 NOTE — ED Triage Notes (Signed)
PATIENT REPORTS THAT SHE MISSED STEPPED AND FELL DOWN LAST 2-3 STEPS. REPORTS HITTING HER POSTERIOR HEAD, DENIES LOC. REPORTS SOME DIZZINESS AT FIRST AND LITTLE BLURRED VISION. DENIES VOMITING.

## 2017-10-23 NOTE — ED Provider Notes (Signed)
[facility] DEPT Provider Note   CSN: [id number] Arrival date & time: 10/23/17  8242     History   Chief Complaint Chief Complaint  Patient presents with  . Head Injury  . Fall  . Neck Pain    HPI Linda Christian is a 58 y.o. female.  The history is provided by the patient and the spouse.  Head Injury   The incident occurred 3 to 5 hours ago. She came to the ER via walk-in. There was no loss of consciousness. The pain is moderate. The pain has been constant since the injury. Pertinent negatives include no vomiting and no weakness. She has tried nothing for the symptoms.  Fall  Associated symptoms include headaches. Pertinent negatives include no chest pain and no shortness of breath.  Neck Pain   Associated symptoms include headaches. Pertinent negatives include no chest pain and no weakness.  Patient with history of diabetes presents after mechanical fall.  She reports she missed a step and fell down about 3 steps.  She hit the posterior aspect of her head.  Denies loss of conscious, but reports headache.  She also reports  mild dizziness.  She also reports neck pain.  No chest pain shortness of breath.  No vomiting.  She takes an aspirin daily.  Past Medical History:  Diagnosis Date  . Allergic rhinitis   . Arthritis    cervical spine; difficulty turning head from side to side  . Diabetes mellitus without complication ([place])   . Eczema    perineal area  . Glucose intolerance (impaired glucose tolerance)    diabetes type II  . Hx of colonic polyp    hyperplastic 4/08  . Hyperlipidemia   . Hypertension   . Hypertension    under control; has been on med. x 8-9 yrs.  . Irregular periods/menstrual cycles   . Menometrorrhagia   . Nipple discharge 09/2011   left  . Seasonal allergies     Patient Active Problem List   Diagnosis Date Noted  . Eustachian tube dysfunction 04/17/2014  . Sinusitis 11/29/2013  . Preventative health care  06/25/2012  . Menorrhagia 11/19/2011  . Anemia 11/19/2011  . Neck pain 10/26/2011  . Bloody discharge from nipple - central left nipple at 9:00 09/15/2011  . Abnormal liver function 08/10/2011  . COLONIC POLYPS, HYPERPLASTIC, HX OF 01/13/2010  . METRORRHAGIA 01/02/2008  . HYPOKALEMIA 09/02/2007  . HYPERLIPIDEMIA 08/30/2007  . Diabetes mellitus type 2, controlled ([place]) 08/05/2007  . Essential hypertension 03/23/2007  . ALLERGIC RHINITIS 03/23/2007  . HEART MURMUR, HX OF 03/23/2007    Past Surgical History:  Procedure Laterality Date  . BREAST DUCTAL SYSTEM EXCISION  10/02/2011   Procedure: EXCISION DUCTAL SYSTEM BREAST;  Surgeon: Imogene Burn. Georgette Dover, MD;  Location: [facility];  Service: General;  Laterality: Left;  left nipple duct excision  . BREAST SURGERY  10/02/2011  . COLONOSCOPY W/ POLYPECTOMY    . HEMORRHOID SURGERY     hemorhoidectomy  . HEMORRHOID SURGERY  04/08/2007    OB History    No data available       Home Medications    Prior to Admission medications   Medication Sig Start Date End Date Taking? Authorizing Provider  aspirin EC 81 MG tablet Take 81 mg by mouth every morning.    [provider]  fluticasone (FLONASE) 50 MCG/ACT nasal spray Place 2 sprays into both nostrils daily as needed. Patient not taking: Reported on 07/28/2016 04/16/14  08/08/17  Brunetta Jeans, PA-C  hydrochlorothiazide (HYDRODIURIL) 25 MG tablet TAKE 1 TABLET DAILY (NEED OFFICE VISIT FOR MORE REFILLS ) Patient taking differently: TAKE 25 MG BY MOUTH EVERY MORNING 07/19/15   Shawna Orleans, Doe-Hyun R, DO  Multiple Vitamin (MULTIVITAMIN) tablet Take 1 tablet by mouth daily.    [provider]  potassium chloride SA (K-DUR,KLOR-CON) 20 MEQ tablet Take 1 tablet (20 mEq total) by mouth daily. 07/28/16   Domenic Moras, PA-C  sitaGLIPtin (JANUVIA) 50 MG tablet Take 50 mg by mouth every morning.    [provider]    Family History Family History  Problem Relation  Age of Onset  . Hypertension Mother   . Diabetes Mother        type II  . Hypertension Father   . Diabetes Father        type II  . Anesthesia problems Father        renal function slow after anesthesia  . Other Unknown        no FH of colon cancer  . Cancer Neg Hx        negative for colon cancer  . Heart attack Neg Hx   . Hyperlipidemia Neg Hx   . Sudden death Neg Hx   . Colon cancer Neg Hx     Social History Social History   Tobacco Use  . Smoking status: Former Research scientist (life sciences)  . Smokeless tobacco: Never Used  . Tobacco comment: quit smoking 1982  Substance Use Topics  . Alcohol use: No  . Drug use: No     Allergies   Shellfish allergy   Review of Systems Review of Systems  Constitutional: Negative for fever.  Respiratory: Negative for shortness of breath.   Cardiovascular: Negative for chest pain.  Gastrointestinal: Negative for vomiting.  Musculoskeletal: Positive for neck pain.  Neurological: Positive for headaches. Negative for weakness.  All other systems reviewed and are negative.    Physical Exam Updated Vital Signs BP 124/78   Pulse 80   Temp 98.9 F (37.2 C) (Oral)   Resp 18   SpO2 100%   Physical Exam CONSTITUTIONAL: Well developed/well nourished HEAD: Normocephalic/atraumatic, diffuse tenderness, no crepitus or step-off EYES: EOMI/PERRL ENMT: Mucous membranes moist SPINE/BACK: Cervical spine tenderness, no thoracic or lumbar tenderness.  No bruising/crepitance/stepoffs noted to spine CV: S1/S2 noted, no murmurs/rubs/gallops noted LUNGS: Lungs are clear to auscultation bilaterally, no apparent distress ABDOMEN: soft, nontender, no rebound or guarding, bowel sounds noted throughout abdomen GU:no cva tenderness NEURO: Pt is awake/alert/appropriate, moves all extremitiesx4.  No facial droop.  GCS - 15 EXTREMITIES: pulses normal/equal, full ROM, All other extremities/joints palpated/ranged and nontender SKIN: warm, color normal PSYCH: no  abnormalities of mood noted, alert and oriented to situation   ED Treatments / Results  Labs (all labs ordered are listed, but only abnormal results are displayed) Labs Reviewed - No data to display  EKG  EKG Interpretation None       Radiology Ct Head Wo Contrast  Result Date: 10/23/2017 CLINICAL DATA:  [place] down steps last night striking the posterior head and neck. Dizziness and blurred vision. EXAM: CT HEAD WITHOUT CONTRAST CT CERVICAL SPINE WITHOUT CONTRAST TECHNIQUE: Multidetector CT imaging of the head and cervical spine was performed following the standard protocol without intravenous contrast. Multiplanar CT image reconstructions of the cervical spine were also generated. COMPARISON:  Cervical MRI 12/11/2012. Cervical radiography 11/03/2016 FINDINGS: CT HEAD FINDINGS Brain: The brain shows a normal appearance without evidence of malformation,  atrophy, old or acute small or large vessel infarction, mass lesion, hemorrhage, hydrocephalus or extra-axial collection. Vascular: No hyperdense vessel. No evidence of atherosclerotic calcification. Skull: Normal.  No traumatic finding.  No focal bone lesion. Sinuses/Orbits: Sinuses are clear. Orbits appear normal. Mastoids are clear. Other: None significant CT CERVICAL SPINE FINDINGS Alignment: Straightening of the normal cervical lordosis. Skull base and vertebrae: Prominent anterior bridging osteophytes from C2 to the thoracic region consistent with diffuse idiopathic skeletal hyperostosis. No evidence of fracture. Soft tissues and spinal canal: Negative Disc levels: No significant disc pathology, because of the bridging anterior osteophytes which result in functional fusion. No central canal stenosis. Bony foraminal narrowing on the left at C3-4, C4-5 and C5-6. No right foraminal narrowing. Upper chest: Negative Other: None IMPRESSION: Head CT: Normal.  No traumatic finding. Cervical spine CT: No traumatic finding. Solid bridging anterior  osteophytes from C2 into the thoracic region consistent with diffuse idiopathic skeletal hyperostosis. Electronically Signed   By: Nelson Chimes M.D.   On: 10/23/2017 11:17   Ct Cervical Spine Wo Contrast  Result Date: 10/23/2017 CLINICAL DATA:  [place] down steps last night striking the posterior head and neck. Dizziness and blurred vision. EXAM: CT HEAD WITHOUT CONTRAST CT CERVICAL SPINE WITHOUT CONTRAST TECHNIQUE: Multidetector CT imaging of the head and cervical spine was performed following the standard protocol without intravenous contrast. Multiplanar CT image reconstructions of the cervical spine were also generated. COMPARISON:  Cervical MRI 12/11/2012. Cervical radiography 11/03/2016 FINDINGS: CT HEAD FINDINGS Brain: The brain shows a normal appearance without evidence of malformation, atrophy, old or acute small or large vessel infarction, mass lesion, hemorrhage, hydrocephalus or extra-axial collection. Vascular: No hyperdense vessel. No evidence of atherosclerotic calcification. Skull: Normal.  No traumatic finding.  No focal bone lesion. Sinuses/Orbits: Sinuses are clear. Orbits appear normal. Mastoids are clear. Other: None significant CT CERVICAL SPINE FINDINGS Alignment: Straightening of the normal cervical lordosis. Skull base and vertebrae: Prominent anterior bridging osteophytes from C2 to the thoracic region consistent with diffuse idiopathic skeletal hyperostosis. No evidence of fracture. Soft tissues and spinal canal: Negative Disc levels: No significant disc pathology, because of the bridging anterior osteophytes which result in functional fusion. No central canal stenosis. Bony foraminal narrowing on the left at C3-4, C4-5 and C5-6. No right foraminal narrowing. Upper chest: Negative Other: None IMPRESSION: Head CT: Normal.  No traumatic finding. Cervical spine CT: No traumatic finding. Solid bridging anterior osteophytes from C2 into the thoracic region consistent with diffuse idiopathic  skeletal hyperostosis. Electronically Signed   By: Nelson Chimes M.D.   On: 10/23/2017 11:17    Procedures Procedures (including critical care time)  Medications Ordered in ED Medications  acetaminophen (TYLENOL) tablet 650 mg (650 mg Oral Given 10/23/17 1053)     Initial Impression / Assessment and Plan / ED Course  I have reviewed the triage vital signs and the nursing notes.       CT imaging performed this patient fell hitting her head, reports nausea and headache and is on aspirin.  CT head negative for acute intracranial hemorrhage.  CT C-spine is negative.  No other signs of acute traumatic injury.  Patient stable for discharge.  Final Clinical Impressions(s) / ED Diagnoses   Final diagnoses:  Concussion without loss of consciousness, initial encounter  Strain of neck muscle, initial encounter    ED Discharge Orders    None       Ripley Fraise, MD 10/23/17 1150

## 2019-09-05 DIAGNOSIS — H6592 Unspecified nonsuppurative otitis media, left ear: Secondary | ICD-10-CM | POA: Diagnosis not present

## 2019-09-25 DIAGNOSIS — L83 Acanthosis nigricans: Secondary | ICD-10-CM | POA: Diagnosis not present

## 2019-09-25 DIAGNOSIS — E119 Type 2 diabetes mellitus without complications: Secondary | ICD-10-CM | POA: Diagnosis not present

## 2019-09-25 DIAGNOSIS — E785 Hyperlipidemia, unspecified: Secondary | ICD-10-CM | POA: Diagnosis not present

## 2019-09-25 DIAGNOSIS — I10 Essential (primary) hypertension: Secondary | ICD-10-CM | POA: Diagnosis not present

## 2019-09-25 DIAGNOSIS — M4722 Other spondylosis with radiculopathy, cervical region: Secondary | ICD-10-CM | POA: Diagnosis not present

## 2019-09-25 DIAGNOSIS — E663 Overweight: Secondary | ICD-10-CM | POA: Diagnosis not present

## 2019-09-29 DIAGNOSIS — Z79891 Long term (current) use of opiate analgesic: Secondary | ICD-10-CM | POA: Diagnosis not present

## 2019-09-29 DIAGNOSIS — M545 Low back pain: Secondary | ICD-10-CM | POA: Diagnosis not present

## 2019-10-25 DIAGNOSIS — E663 Overweight: Secondary | ICD-10-CM | POA: Diagnosis not present

## 2019-10-25 DIAGNOSIS — E119 Type 2 diabetes mellitus without complications: Secondary | ICD-10-CM | POA: Diagnosis not present

## 2019-10-25 DIAGNOSIS — L83 Acanthosis nigricans: Secondary | ICD-10-CM | POA: Diagnosis not present

## 2019-10-25 DIAGNOSIS — I10 Essential (primary) hypertension: Secondary | ICD-10-CM | POA: Diagnosis not present

## 2019-10-25 DIAGNOSIS — M4722 Other spondylosis with radiculopathy, cervical region: Secondary | ICD-10-CM | POA: Diagnosis not present

## 2019-10-25 DIAGNOSIS — E785 Hyperlipidemia, unspecified: Secondary | ICD-10-CM | POA: Diagnosis not present

## 2019-11-15 DIAGNOSIS — E785 Hyperlipidemia, unspecified: Secondary | ICD-10-CM | POA: Diagnosis not present

## 2019-11-15 DIAGNOSIS — E1165 Type 2 diabetes mellitus with hyperglycemia: Secondary | ICD-10-CM | POA: Diagnosis not present

## 2019-11-15 DIAGNOSIS — I10 Essential (primary) hypertension: Secondary | ICD-10-CM | POA: Diagnosis not present

## 2019-11-15 DIAGNOSIS — Z6828 Body mass index (BMI) 28.0-28.9, adult: Secondary | ICD-10-CM | POA: Diagnosis not present

## 2019-11-24 ENCOUNTER — Ambulatory Visit: Payer: Self-pay | Attending: Internal Medicine

## 2019-11-24 DIAGNOSIS — Z23 Encounter for immunization: Secondary | ICD-10-CM

## 2019-11-24 NOTE — Progress Notes (Signed)
   Covid-19 Vaccination Clinic  Name:  Linda Christian    MRN: [id number] DOB: 1961-07-18  11/24/2019  Linda Christian was observed post Covid-19 immunization for 15 minutes without incident. She was provided with Vaccine Information Sheet and instruction to access the V-Safe system.   Linda Christian was instructed to call 911 with any severe reactions post vaccine: Marland Kitchen Difficulty breathing  . Swelling of face and throat  . A fast heartbeat  . A bad rash all over body  . Dizziness and weakness   Immunizations Administered    Name Date Dose VIS Date Route   Pfizer COVID-19 Vaccine 11/24/2019  3:00 PM 0.3 mL 08/11/2019 Intramuscular   Manufacturer: [place]   Lot: [id number]   [place]: [id number]

## 2019-12-06 DIAGNOSIS — E1165 Type 2 diabetes mellitus with hyperglycemia: Secondary | ICD-10-CM | POA: Diagnosis not present

## 2019-12-06 DIAGNOSIS — Z6828 Body mass index (BMI) 28.0-28.9, adult: Secondary | ICD-10-CM | POA: Diagnosis not present

## 2019-12-06 DIAGNOSIS — I10 Essential (primary) hypertension: Secondary | ICD-10-CM | POA: Diagnosis not present

## 2019-12-06 DIAGNOSIS — E785 Hyperlipidemia, unspecified: Secondary | ICD-10-CM | POA: Diagnosis not present

## 2019-12-19 ENCOUNTER — Ambulatory Visit: Payer: Self-pay | Attending: Internal Medicine

## 2019-12-19 DIAGNOSIS — Z23 Encounter for immunization: Secondary | ICD-10-CM

## 2019-12-19 NOTE — Progress Notes (Signed)
   Covid-19 Vaccination Clinic  Name:  FIDELA UHLENHAKE    MRN: [id number] DOB: 02/26/1961  12/19/2019  Ms. Welborn was observed post Covid-19 immunization for 15 minutes without incident. She was provided with Vaccine Information Sheet and instruction to access the V-Safe system.   Ms. Sisto was instructed to call 911 with any severe reactions post vaccine: Marland Kitchen Difficulty breathing  . Swelling of face and throat  . A fast heartbeat  . A bad rash all over body  . Dizziness and weakness   Immunizations Administered    Name Date Dose VIS Date Route   Pfizer COVID-19 Vaccine 12/19/2019  2:47 PM 0.3 mL 10/25/2018 Intramuscular   Manufacturer: [place]   Lot: [id number]   [place]: [id number]

## 2020-01-18 DIAGNOSIS — I10 Essential (primary) hypertension: Secondary | ICD-10-CM | POA: Diagnosis not present

## 2020-01-18 DIAGNOSIS — M4722 Other spondylosis with radiculopathy, cervical region: Secondary | ICD-10-CM | POA: Diagnosis not present

## 2020-01-18 DIAGNOSIS — E119 Type 2 diabetes mellitus without complications: Secondary | ICD-10-CM | POA: Diagnosis not present

## 2020-01-18 DIAGNOSIS — R590 Localized enlarged lymph nodes: Secondary | ICD-10-CM | POA: Diagnosis not present

## 2020-01-18 DIAGNOSIS — G720 Drug-induced myopathy: Secondary | ICD-10-CM | POA: Diagnosis not present

## 2020-01-18 DIAGNOSIS — E663 Overweight: Secondary | ICD-10-CM | POA: Diagnosis not present

## 2020-01-18 DIAGNOSIS — L83 Acanthosis nigricans: Secondary | ICD-10-CM | POA: Diagnosis not present

## 2020-01-18 DIAGNOSIS — E785 Hyperlipidemia, unspecified: Secondary | ICD-10-CM | POA: Diagnosis not present

## 2020-01-20 DIAGNOSIS — M5136 Other intervertebral disc degeneration, lumbar region: Secondary | ICD-10-CM | POA: Diagnosis not present

## 2020-01-20 DIAGNOSIS — M5030 Other cervical disc degeneration, unspecified cervical region: Secondary | ICD-10-CM | POA: Diagnosis not present

## 2020-02-23 DIAGNOSIS — L900 Lichen sclerosus et atrophicus: Secondary | ICD-10-CM | POA: Diagnosis not present

## 2020-03-06 DIAGNOSIS — I10 Essential (primary) hypertension: Secondary | ICD-10-CM | POA: Diagnosis not present

## 2020-03-06 DIAGNOSIS — Z6828 Body mass index (BMI) 28.0-28.9, adult: Secondary | ICD-10-CM | POA: Diagnosis not present

## 2020-03-06 DIAGNOSIS — E785 Hyperlipidemia, unspecified: Secondary | ICD-10-CM | POA: Diagnosis not present

## 2020-03-06 DIAGNOSIS — E1165 Type 2 diabetes mellitus with hyperglycemia: Secondary | ICD-10-CM | POA: Diagnosis not present

## 2020-03-18 DIAGNOSIS — M5416 Radiculopathy, lumbar region: Secondary | ICD-10-CM | POA: Diagnosis not present

## 2020-03-18 DIAGNOSIS — M5412 Radiculopathy, cervical region: Secondary | ICD-10-CM | POA: Diagnosis not present

## 2020-03-28 DIAGNOSIS — M791 Myalgia, unspecified site: Secondary | ICD-10-CM | POA: Diagnosis not present

## 2020-05-08 DIAGNOSIS — Z79891 Long term (current) use of opiate analgesic: Secondary | ICD-10-CM | POA: Diagnosis not present

## 2020-05-08 DIAGNOSIS — M5412 Radiculopathy, cervical region: Secondary | ICD-10-CM | POA: Diagnosis not present

## 2020-05-08 DIAGNOSIS — M542 Cervicalgia: Secondary | ICD-10-CM | POA: Diagnosis not present

## 2020-05-24 DIAGNOSIS — M542 Cervicalgia: Secondary | ICD-10-CM | POA: Diagnosis not present

## 2020-05-27 ENCOUNTER — Other Ambulatory Visit: Payer: Self-pay | Admitting: Physical Medicine and Rehabilitation

## 2020-05-27 DIAGNOSIS — E041 Nontoxic single thyroid nodule: Secondary | ICD-10-CM

## 2020-05-31 ENCOUNTER — Ambulatory Visit
Admission: RE | Admit: 2020-05-31 | Discharge: 2020-05-31 | Disposition: A | Discharge status: Home / Self Care | Payer: PPO | Source: Ambulatory Visit | Attending: Physical Medicine and Rehabilitation | Admitting: Physical Medicine and Rehabilitation

## 2020-05-31 DIAGNOSIS — E041 Nontoxic single thyroid nodule: Secondary | ICD-10-CM

## 2020-06-05 DIAGNOSIS — E1165 Type 2 diabetes mellitus with hyperglycemia: Secondary | ICD-10-CM | POA: Diagnosis not present

## 2020-06-05 DIAGNOSIS — E785 Hyperlipidemia, unspecified: Secondary | ICD-10-CM | POA: Diagnosis not present

## 2020-06-12 DIAGNOSIS — E785 Hyperlipidemia, unspecified: Secondary | ICD-10-CM | POA: Diagnosis not present

## 2020-06-12 DIAGNOSIS — I10 Essential (primary) hypertension: Secondary | ICD-10-CM | POA: Diagnosis not present

## 2020-06-12 DIAGNOSIS — E041 Nontoxic single thyroid nodule: Secondary | ICD-10-CM | POA: Diagnosis not present

## 2020-06-12 DIAGNOSIS — E1165 Type 2 diabetes mellitus with hyperglycemia: Secondary | ICD-10-CM | POA: Diagnosis not present

## 2020-06-12 DIAGNOSIS — Z6828 Body mass index (BMI) 28.0-28.9, adult: Secondary | ICD-10-CM | POA: Diagnosis not present

## 2020-06-18 DIAGNOSIS — Z23 Encounter for immunization: Secondary | ICD-10-CM | POA: Diagnosis not present

## 2020-06-27 DIAGNOSIS — G894 Chronic pain syndrome: Secondary | ICD-10-CM | POA: Diagnosis not present

## 2020-07-29 DIAGNOSIS — L83 Acanthosis nigricans: Secondary | ICD-10-CM | POA: Diagnosis not present

## 2020-07-29 DIAGNOSIS — E663 Overweight: Secondary | ICD-10-CM | POA: Diagnosis not present

## 2020-07-29 DIAGNOSIS — I10 Essential (primary) hypertension: Secondary | ICD-10-CM | POA: Diagnosis not present

## 2020-07-29 DIAGNOSIS — E119 Type 2 diabetes mellitus without complications: Secondary | ICD-10-CM | POA: Diagnosis not present

## 2020-07-29 DIAGNOSIS — M4722 Other spondylosis with radiculopathy, cervical region: Secondary | ICD-10-CM | POA: Diagnosis not present

## 2020-07-29 DIAGNOSIS — E785 Hyperlipidemia, unspecified: Secondary | ICD-10-CM | POA: Diagnosis not present

## 2020-07-29 DIAGNOSIS — M542 Cervicalgia: Secondary | ICD-10-CM | POA: Diagnosis not present

## 2020-08-05 ENCOUNTER — Ambulatory Visit: Payer: PPO | Attending: Internal Medicine

## 2020-08-05 DIAGNOSIS — Z23 Encounter for immunization: Secondary | ICD-10-CM

## 2020-08-05 NOTE — Progress Notes (Signed)
   Covid-19 Vaccination Clinic  Name:  Linda Christian    MRN: [id number] DOB: [birth year]  08/05/2020  [name] was observed post Covid-19 immunization for 15 minutes without incident. She was provided with Vaccine Information Sheet and instruction to access the V-Safe system.   [name] was instructed to call 911 with any severe reactions post vaccine: Marland Kitchen Difficulty breathing  . Swelling of face and throat  . A fast heartbeat  . A bad rash all over body  . Dizziness and weakness   Immunizations Administered    Name Date Dose VIS Date Route   Pfizer COVID-19 Vaccine 08/05/2020  1:31 PM 0.3 mL 06/19/2020 Intramuscular   Manufacturer: [place]   Lot: [id number]   [place]: [address]-0

## 2020-10-24 DIAGNOSIS — M542 Cervicalgia: Secondary | ICD-10-CM | POA: Diagnosis not present

## 2020-11-11 DIAGNOSIS — E1169 Type 2 diabetes mellitus with other specified complication: Secondary | ICD-10-CM | POA: Diagnosis not present

## 2020-11-11 DIAGNOSIS — M4722 Other spondylosis with radiculopathy, cervical region: Secondary | ICD-10-CM | POA: Diagnosis not present

## 2020-11-11 DIAGNOSIS — E785 Hyperlipidemia, unspecified: Secondary | ICD-10-CM | POA: Diagnosis not present

## 2020-11-11 DIAGNOSIS — G8929 Other chronic pain: Secondary | ICD-10-CM | POA: Diagnosis not present

## 2020-11-11 DIAGNOSIS — I10 Essential (primary) hypertension: Secondary | ICD-10-CM | POA: Diagnosis not present

## 2020-11-14 DIAGNOSIS — Z6827 Body mass index (BMI) 27.0-27.9, adult: Secondary | ICD-10-CM | POA: Diagnosis not present

## 2020-11-14 DIAGNOSIS — N959 Unspecified menopausal and perimenopausal disorder: Secondary | ICD-10-CM | POA: Diagnosis not present

## 2020-11-14 DIAGNOSIS — Z01419 Encounter for gynecological examination (general) (routine) without abnormal findings: Secondary | ICD-10-CM | POA: Diagnosis not present

## 2020-11-14 DIAGNOSIS — Z01411 Encounter for gynecological examination (general) (routine) with abnormal findings: Secondary | ICD-10-CM | POA: Diagnosis not present

## 2020-11-14 DIAGNOSIS — Z113 Encounter for screening for infections with a predominantly sexual mode of transmission: Secondary | ICD-10-CM | POA: Diagnosis not present

## 2020-11-14 DIAGNOSIS — Z124 Encounter for screening for malignant neoplasm of cervix: Secondary | ICD-10-CM | POA: Diagnosis not present

## 2020-11-14 DIAGNOSIS — Z1231 Encounter for screening mammogram for malignant neoplasm of breast: Secondary | ICD-10-CM | POA: Diagnosis not present

## 2020-11-19 ENCOUNTER — Other Ambulatory Visit: Payer: Self-pay | Admitting: Obstetrics and Gynecology

## 2020-11-19 DIAGNOSIS — H903 Sensorineural hearing loss, bilateral: Secondary | ICD-10-CM | POA: Diagnosis not present

## 2020-11-19 DIAGNOSIS — E2839 Other primary ovarian failure: Secondary | ICD-10-CM

## 2020-11-25 DIAGNOSIS — R350 Frequency of micturition: Secondary | ICD-10-CM | POA: Diagnosis not present

## 2020-12-04 DIAGNOSIS — E041 Nontoxic single thyroid nodule: Secondary | ICD-10-CM | POA: Diagnosis not present

## 2020-12-04 DIAGNOSIS — E1165 Type 2 diabetes mellitus with hyperglycemia: Secondary | ICD-10-CM | POA: Diagnosis not present

## 2020-12-04 DIAGNOSIS — E785 Hyperlipidemia, unspecified: Secondary | ICD-10-CM | POA: Diagnosis not present

## 2021-01-06 DIAGNOSIS — I10 Essential (primary) hypertension: Secondary | ICD-10-CM | POA: Diagnosis not present

## 2021-01-06 DIAGNOSIS — E1169 Type 2 diabetes mellitus with other specified complication: Secondary | ICD-10-CM | POA: Diagnosis not present

## 2021-01-06 DIAGNOSIS — E785 Hyperlipidemia, unspecified: Secondary | ICD-10-CM | POA: Diagnosis not present

## 2021-01-06 DIAGNOSIS — M4722 Other spondylosis with radiculopathy, cervical region: Secondary | ICD-10-CM | POA: Diagnosis not present

## 2021-01-06 DIAGNOSIS — G8929 Other chronic pain: Secondary | ICD-10-CM | POA: Diagnosis not present

## 2021-01-08 DIAGNOSIS — E785 Hyperlipidemia, unspecified: Secondary | ICD-10-CM | POA: Diagnosis not present

## 2021-01-08 DIAGNOSIS — E1165 Type 2 diabetes mellitus with hyperglycemia: Secondary | ICD-10-CM | POA: Diagnosis not present

## 2021-01-08 DIAGNOSIS — E041 Nontoxic single thyroid nodule: Secondary | ICD-10-CM | POA: Diagnosis not present

## 2021-01-08 DIAGNOSIS — I10 Essential (primary) hypertension: Secondary | ICD-10-CM | POA: Diagnosis not present

## 2021-01-08 DIAGNOSIS — Z6828 Body mass index (BMI) 28.0-28.9, adult: Secondary | ICD-10-CM | POA: Diagnosis not present

## 2021-01-31 DIAGNOSIS — U071 COVID-19: Secondary | ICD-10-CM | POA: Diagnosis not present

## 2021-01-31 DIAGNOSIS — R051 Acute cough: Secondary | ICD-10-CM | POA: Diagnosis not present

## 2021-02-10 DIAGNOSIS — E785 Hyperlipidemia, unspecified: Secondary | ICD-10-CM | POA: Diagnosis not present

## 2021-02-10 DIAGNOSIS — I10 Essential (primary) hypertension: Secondary | ICD-10-CM | POA: Diagnosis not present

## 2021-02-10 DIAGNOSIS — M4722 Other spondylosis with radiculopathy, cervical region: Secondary | ICD-10-CM | POA: Diagnosis not present

## 2021-02-10 DIAGNOSIS — E1169 Type 2 diabetes mellitus with other specified complication: Secondary | ICD-10-CM | POA: Diagnosis not present

## 2021-02-10 DIAGNOSIS — G8929 Other chronic pain: Secondary | ICD-10-CM | POA: Diagnosis not present

## 2021-03-06 DIAGNOSIS — E1169 Type 2 diabetes mellitus with other specified complication: Secondary | ICD-10-CM | POA: Diagnosis not present

## 2021-03-06 DIAGNOSIS — I10 Essential (primary) hypertension: Secondary | ICD-10-CM | POA: Diagnosis not present

## 2021-03-06 DIAGNOSIS — E785 Hyperlipidemia, unspecified: Secondary | ICD-10-CM | POA: Diagnosis not present

## 2021-03-06 DIAGNOSIS — M4722 Other spondylosis with radiculopathy, cervical region: Secondary | ICD-10-CM | POA: Diagnosis not present

## 2021-03-06 DIAGNOSIS — G8929 Other chronic pain: Secondary | ICD-10-CM | POA: Diagnosis not present

## 2021-03-20 DIAGNOSIS — E1169 Type 2 diabetes mellitus with other specified complication: Secondary | ICD-10-CM | POA: Diagnosis not present

## 2021-03-20 DIAGNOSIS — E785 Hyperlipidemia, unspecified: Secondary | ICD-10-CM | POA: Diagnosis not present

## 2021-03-20 DIAGNOSIS — M4722 Other spondylosis with radiculopathy, cervical region: Secondary | ICD-10-CM | POA: Diagnosis not present

## 2021-03-20 DIAGNOSIS — G8929 Other chronic pain: Secondary | ICD-10-CM | POA: Diagnosis not present

## 2021-03-20 DIAGNOSIS — I10 Essential (primary) hypertension: Secondary | ICD-10-CM | POA: Diagnosis not present

## 2021-04-10 DIAGNOSIS — E785 Hyperlipidemia, unspecified: Secondary | ICD-10-CM | POA: Diagnosis not present

## 2021-04-10 DIAGNOSIS — E1165 Type 2 diabetes mellitus with hyperglycemia: Secondary | ICD-10-CM | POA: Diagnosis not present

## 2021-04-17 DIAGNOSIS — R7982 Elevated C-reactive protein (CRP): Secondary | ICD-10-CM | POA: Diagnosis not present

## 2021-04-17 DIAGNOSIS — I10 Essential (primary) hypertension: Secondary | ICD-10-CM | POA: Diagnosis not present

## 2021-04-17 DIAGNOSIS — E785 Hyperlipidemia, unspecified: Secondary | ICD-10-CM | POA: Diagnosis not present

## 2021-04-17 DIAGNOSIS — E1165 Type 2 diabetes mellitus with hyperglycemia: Secondary | ICD-10-CM | POA: Diagnosis not present

## 2021-04-17 DIAGNOSIS — E041 Nontoxic single thyroid nodule: Secondary | ICD-10-CM | POA: Diagnosis not present

## 2021-04-17 DIAGNOSIS — Z6828 Body mass index (BMI) 28.0-28.9, adult: Secondary | ICD-10-CM | POA: Diagnosis not present

## 2021-05-06 DIAGNOSIS — E1169 Type 2 diabetes mellitus with other specified complication: Secondary | ICD-10-CM | POA: Diagnosis not present

## 2021-05-06 DIAGNOSIS — G8929 Other chronic pain: Secondary | ICD-10-CM | POA: Diagnosis not present

## 2021-05-06 DIAGNOSIS — I10 Essential (primary) hypertension: Secondary | ICD-10-CM | POA: Diagnosis not present

## 2021-05-06 DIAGNOSIS — E785 Hyperlipidemia, unspecified: Secondary | ICD-10-CM | POA: Diagnosis not present

## 2021-05-06 DIAGNOSIS — M4722 Other spondylosis with radiculopathy, cervical region: Secondary | ICD-10-CM | POA: Diagnosis not present

## 2021-05-09 DIAGNOSIS — G894 Chronic pain syndrome: Secondary | ICD-10-CM | POA: Diagnosis not present

## 2021-05-22 DIAGNOSIS — Z23 Encounter for immunization: Secondary | ICD-10-CM | POA: Diagnosis not present

## 2021-05-27 DIAGNOSIS — M79659 Pain in unspecified thigh: Secondary | ICD-10-CM | POA: Diagnosis not present

## 2021-05-27 DIAGNOSIS — S8001XA Contusion of right knee, initial encounter: Secondary | ICD-10-CM | POA: Diagnosis not present

## 2021-05-27 DIAGNOSIS — M25561 Pain in right knee: Secondary | ICD-10-CM | POA: Diagnosis not present

## 2021-05-27 DIAGNOSIS — M79621 Pain in right upper arm: Secondary | ICD-10-CM | POA: Diagnosis not present

## 2021-05-27 DIAGNOSIS — S86911A Strain of unspecified muscle(s) and tendon(s) at lower leg level, right leg, initial encounter: Secondary | ICD-10-CM | POA: Diagnosis not present

## 2021-06-04 DIAGNOSIS — G8929 Other chronic pain: Secondary | ICD-10-CM | POA: Diagnosis not present

## 2021-06-04 DIAGNOSIS — E785 Hyperlipidemia, unspecified: Secondary | ICD-10-CM | POA: Diagnosis not present

## 2021-06-04 DIAGNOSIS — E1169 Type 2 diabetes mellitus with other specified complication: Secondary | ICD-10-CM | POA: Diagnosis not present

## 2021-06-04 DIAGNOSIS — M4722 Other spondylosis with radiculopathy, cervical region: Secondary | ICD-10-CM | POA: Diagnosis not present

## 2021-06-04 DIAGNOSIS — I10 Essential (primary) hypertension: Secondary | ICD-10-CM | POA: Diagnosis not present

## 2021-06-13 DIAGNOSIS — U071 COVID-19: Secondary | ICD-10-CM | POA: Diagnosis not present

## 2021-06-26 DIAGNOSIS — E041 Nontoxic single thyroid nodule: Secondary | ICD-10-CM | POA: Diagnosis not present

## 2021-06-26 DIAGNOSIS — E042 Nontoxic multinodular goiter: Secondary | ICD-10-CM | POA: Diagnosis not present

## 2021-08-04 DIAGNOSIS — Z1389 Encounter for screening for other disorder: Secondary | ICD-10-CM | POA: Diagnosis not present

## 2021-08-04 DIAGNOSIS — Z0000 Encounter for general adult medical examination without abnormal findings: Secondary | ICD-10-CM | POA: Diagnosis not present

## 2021-08-12 DIAGNOSIS — E1165 Type 2 diabetes mellitus with hyperglycemia: Secondary | ICD-10-CM | POA: Diagnosis not present

## 2021-08-12 DIAGNOSIS — E785 Hyperlipidemia, unspecified: Secondary | ICD-10-CM | POA: Diagnosis not present

## 2021-08-19 DIAGNOSIS — E785 Hyperlipidemia, unspecified: Secondary | ICD-10-CM | POA: Diagnosis not present

## 2021-08-19 DIAGNOSIS — E1165 Type 2 diabetes mellitus with hyperglycemia: Secondary | ICD-10-CM | POA: Diagnosis not present

## 2021-08-19 DIAGNOSIS — I10 Essential (primary) hypertension: Secondary | ICD-10-CM | POA: Diagnosis not present

## 2021-08-19 DIAGNOSIS — Z6828 Body mass index (BMI) 28.0-28.9, adult: Secondary | ICD-10-CM | POA: Diagnosis not present

## 2021-08-19 DIAGNOSIS — E041 Nontoxic single thyroid nodule: Secondary | ICD-10-CM | POA: Diagnosis not present

## 2021-08-19 DIAGNOSIS — R7982 Elevated C-reactive protein (CRP): Secondary | ICD-10-CM | POA: Diagnosis not present

## 2021-09-22 DIAGNOSIS — E785 Hyperlipidemia, unspecified: Secondary | ICD-10-CM | POA: Diagnosis not present

## 2021-09-22 DIAGNOSIS — Z23 Encounter for immunization: Secondary | ICD-10-CM | POA: Diagnosis not present

## 2021-09-22 DIAGNOSIS — E1169 Type 2 diabetes mellitus with other specified complication: Secondary | ICD-10-CM | POA: Diagnosis not present

## 2021-09-22 DIAGNOSIS — G8929 Other chronic pain: Secondary | ICD-10-CM | POA: Diagnosis not present

## 2021-09-22 DIAGNOSIS — M4722 Other spondylosis with radiculopathy, cervical region: Secondary | ICD-10-CM | POA: Diagnosis not present

## 2021-09-22 DIAGNOSIS — I10 Essential (primary) hypertension: Secondary | ICD-10-CM | POA: Diagnosis not present

## 2021-12-20 DIAGNOSIS — M542 Cervicalgia: Secondary | ICD-10-CM | POA: Diagnosis not present

## 2021-12-20 DIAGNOSIS — M5459 Other low back pain: Secondary | ICD-10-CM | POA: Diagnosis not present

## 2021-12-20 DIAGNOSIS — M5030 Other cervical disc degeneration, unspecified cervical region: Secondary | ICD-10-CM | POA: Diagnosis not present

## 2021-12-20 DIAGNOSIS — M5136 Other intervertebral disc degeneration, lumbar region: Secondary | ICD-10-CM | POA: Diagnosis not present

## 2021-12-20 DIAGNOSIS — G894 Chronic pain syndrome: Secondary | ICD-10-CM | POA: Diagnosis not present

## 2021-12-25 DIAGNOSIS — Z1231 Encounter for screening mammogram for malignant neoplasm of breast: Secondary | ICD-10-CM | POA: Diagnosis not present

## 2022-01-08 DIAGNOSIS — J019 Acute sinusitis, unspecified: Secondary | ICD-10-CM | POA: Diagnosis not present

## 2022-01-27 DIAGNOSIS — H6983 Other specified disorders of Eustachian tube, bilateral: Secondary | ICD-10-CM | POA: Diagnosis not present

## 2022-01-27 DIAGNOSIS — J209 Acute bronchitis, unspecified: Secondary | ICD-10-CM | POA: Diagnosis not present

## 2022-02-10 DIAGNOSIS — E785 Hyperlipidemia, unspecified: Secondary | ICD-10-CM | POA: Diagnosis not present

## 2022-02-10 DIAGNOSIS — E1165 Type 2 diabetes mellitus with hyperglycemia: Secondary | ICD-10-CM | POA: Diagnosis not present

## 2022-02-17 DIAGNOSIS — I10 Essential (primary) hypertension: Secondary | ICD-10-CM | POA: Diagnosis not present

## 2022-02-17 DIAGNOSIS — E785 Hyperlipidemia, unspecified: Secondary | ICD-10-CM | POA: Diagnosis not present

## 2022-02-17 DIAGNOSIS — J309 Allergic rhinitis, unspecified: Secondary | ICD-10-CM | POA: Diagnosis not present

## 2022-02-17 DIAGNOSIS — E1165 Type 2 diabetes mellitus with hyperglycemia: Secondary | ICD-10-CM | POA: Diagnosis not present

## 2022-04-13 DIAGNOSIS — Z5181 Encounter for therapeutic drug level monitoring: Secondary | ICD-10-CM | POA: Diagnosis not present

## 2022-04-13 DIAGNOSIS — Z79899 Other long term (current) drug therapy: Secondary | ICD-10-CM | POA: Diagnosis not present

## 2022-04-23 DIAGNOSIS — R1030 Lower abdominal pain, unspecified: Secondary | ICD-10-CM | POA: Diagnosis not present

## 2022-04-23 DIAGNOSIS — R350 Frequency of micturition: Secondary | ICD-10-CM | POA: Diagnosis not present

## 2022-04-23 DIAGNOSIS — R300 Dysuria: Secondary | ICD-10-CM | POA: Diagnosis not present

## 2022-05-07 DIAGNOSIS — R350 Frequency of micturition: Secondary | ICD-10-CM | POA: Diagnosis not present

## 2022-05-07 DIAGNOSIS — N3000 Acute cystitis without hematuria: Secondary | ICD-10-CM | POA: Diagnosis not present

## 2022-05-19 DIAGNOSIS — Z6826 Body mass index (BMI) 26.0-26.9, adult: Secondary | ICD-10-CM | POA: Diagnosis not present

## 2022-05-19 DIAGNOSIS — R490 Dysphonia: Secondary | ICD-10-CM | POA: Diagnosis not present

## 2022-05-19 DIAGNOSIS — Z23 Encounter for immunization: Secondary | ICD-10-CM | POA: Diagnosis not present

## 2022-05-19 DIAGNOSIS — R053 Chronic cough: Secondary | ICD-10-CM | POA: Diagnosis not present

## 2022-05-20 ENCOUNTER — Other Ambulatory Visit: Payer: Self-pay | Admitting: Family Medicine

## 2022-05-20 ENCOUNTER — Ambulatory Visit
Admission: RE | Admit: 2022-05-20 | Discharge: 2022-05-20 | Disposition: A | Discharge status: Home / Self Care | Payer: HMO | Source: Ambulatory Visit | Attending: Family Medicine | Admitting: Family Medicine

## 2022-05-20 DIAGNOSIS — R053 Chronic cough: Secondary | ICD-10-CM

## 2022-05-20 DIAGNOSIS — R059 Cough, unspecified: Secondary | ICD-10-CM | POA: Diagnosis not present

## 2022-08-19 ENCOUNTER — Other Ambulatory Visit: Payer: Self-pay | Admitting: Nurse Practitioner

## 2022-08-19 DIAGNOSIS — E041 Nontoxic single thyroid nodule: Secondary | ICD-10-CM

## 2022-10-20 ENCOUNTER — Ambulatory Visit
Admission: RE | Admit: 2022-10-20 | Discharge: 2022-10-20 | Disposition: A | Discharge status: Home / Self Care | Payer: HMO | Source: Ambulatory Visit | Attending: Nurse Practitioner | Admitting: Nurse Practitioner

## 2022-10-20 DIAGNOSIS — E041 Nontoxic single thyroid nodule: Secondary | ICD-10-CM

## 2022-10-22 ENCOUNTER — Other Ambulatory Visit: Payer: Self-pay | Admitting: Nurse Practitioner

## 2022-10-22 DIAGNOSIS — E041 Nontoxic single thyroid nodule: Secondary | ICD-10-CM

## 2022-11-10 ENCOUNTER — Ambulatory Visit
Admission: RE | Admit: 2022-11-10 | Discharge: 2022-11-10 | Disposition: A | Discharge status: Home / Self Care | Payer: HMO | Source: Ambulatory Visit | Attending: Nurse Practitioner | Admitting: Nurse Practitioner

## 2022-11-10 ENCOUNTER — Other Ambulatory Visit (HOSPITAL_COMMUNITY)
Admission: RE | Admit: 2022-11-10 | Discharge: 2022-11-10 | Disposition: A | Discharge status: Home / Self Care | Payer: HMO | Source: Ambulatory Visit | Attending: Interventional Radiology | Admitting: Interventional Radiology

## 2022-11-10 DIAGNOSIS — E041 Nontoxic single thyroid nodule: Secondary | ICD-10-CM

## 2022-11-11 LAB — CYTOLOGY - NON PAP

## 2023-08-23 ENCOUNTER — Other Ambulatory Visit (HOSPITAL_BASED_OUTPATIENT_CLINIC_OR_DEPARTMENT_OTHER): Payer: Self-pay | Admitting: Nurse Practitioner

## 2023-08-23 DIAGNOSIS — E782 Mixed hyperlipidemia: Secondary | ICD-10-CM

## 2023-09-07 ENCOUNTER — Encounter (HOSPITAL_BASED_OUTPATIENT_CLINIC_OR_DEPARTMENT_OTHER): Payer: Self-pay | Admitting: Nurse Practitioner

## 2023-09-07 DIAGNOSIS — M159 Polyosteoarthritis, unspecified: Secondary | ICD-10-CM | POA: Diagnosis not present

## 2023-09-07 DIAGNOSIS — E782 Mixed hyperlipidemia: Secondary | ICD-10-CM | POA: Diagnosis not present

## 2023-09-07 DIAGNOSIS — E1165 Type 2 diabetes mellitus with hyperglycemia: Secondary | ICD-10-CM | POA: Diagnosis not present

## 2023-09-07 DIAGNOSIS — E041 Nontoxic single thyroid nodule: Secondary | ICD-10-CM | POA: Diagnosis not present

## 2023-09-07 DIAGNOSIS — Z0000 Encounter for general adult medical examination without abnormal findings: Secondary | ICD-10-CM | POA: Diagnosis not present

## 2023-09-07 DIAGNOSIS — R059 Cough, unspecified: Secondary | ICD-10-CM | POA: Diagnosis not present

## 2023-09-07 DIAGNOSIS — I10 Essential (primary) hypertension: Secondary | ICD-10-CM | POA: Diagnosis not present

## 2023-09-07 DIAGNOSIS — G720 Drug-induced myopathy: Secondary | ICD-10-CM | POA: Diagnosis not present

## 2023-09-07 DIAGNOSIS — N182 Chronic kidney disease, stage 2 (mild): Secondary | ICD-10-CM | POA: Diagnosis not present

## 2023-09-20 ENCOUNTER — Encounter: Payer: Self-pay | Admitting: Pulmonary Disease

## 2023-09-22 ENCOUNTER — Other Ambulatory Visit: Payer: Self-pay | Admitting: Pulmonary Disease

## 2023-09-22 ENCOUNTER — Encounter: Payer: Self-pay | Admitting: Pulmonary Disease

## 2023-09-22 ENCOUNTER — Ambulatory Visit (INDEPENDENT_AMBULATORY_CARE_PROVIDER_SITE_OTHER): Payer: PPO | Admitting: Pulmonary Disease

## 2023-09-22 ENCOUNTER — Other Ambulatory Visit
Admission: RE | Admit: 2023-09-22 | Discharge: 2023-09-22 | Disposition: A | Discharge status: Home / Self Care | Payer: PPO | Source: Ambulatory Visit | Attending: Pulmonary Disease | Admitting: Pulmonary Disease

## 2023-09-22 VITALS — BP 132/84 | HR 103 | Temp 97.5°F | Ht 66.0 in | Wt 167.0 lb

## 2023-09-22 DIAGNOSIS — R053 Chronic cough: Secondary | ICD-10-CM | POA: Insufficient documentation

## 2023-09-22 LAB — CBC WITH DIFFERENTIAL/PLATELET
Abs Immature Granulocytes: 0.01 10*3/uL (ref 0.00–0.07)
Basophils Absolute: 0.1 10*3/uL (ref 0.0–0.1)
Basophils Relative: 1 %
Eosinophils Absolute: 0.2 10*3/uL (ref 0.0–0.5)
Eosinophils Relative: 3 %
HCT: 41 % (ref 36.0–46.0)
Hemoglobin: 13.8 g/dL (ref 12.0–15.0)
Immature Granulocytes: 0 %
Lymphocytes Relative: 39 %
Lymphs Abs: 3 10*3/uL (ref 0.7–4.0)
MCH: 29.1 pg (ref 26.0–34.0)
MCHC: 33.7 g/dL (ref 30.0–36.0)
MCV: 86.3 fL (ref 80.0–100.0)
Monocytes Absolute: 0.6 10*3/uL (ref 0.1–1.0)
Monocytes Relative: 8 %
Neutro Abs: 3.8 10*3/uL (ref 1.7–7.7)
Neutrophils Relative %: 49 %
Platelets: 225 10*3/uL (ref 150–400)
RBC: 4.75 MIL/uL (ref 3.87–5.11)
RDW: 11.9 % (ref 11.5–15.5)
WBC: 7.6 10*3/uL (ref 4.0–10.5)
nRBC: 0 % (ref 0.0–0.2)

## 2023-09-22 LAB — NITRIC OXIDE: Nitric Oxide: 30

## 2023-09-22 MED ORDER — BUDESONIDE-FORMOTEROL FUMARATE 160-4.5 MCG/ACT IN AERO: 2.0000 | INHALATION_SPRAY | Freq: Two times a day (BID) | RESPIRATORY_TRACT | 12 refills | Status: DC

## 2023-09-23 ENCOUNTER — Encounter: Payer: Self-pay | Admitting: Pulmonary Disease

## 2023-09-23 DIAGNOSIS — J4530 Mild persistent asthma, uncomplicated: Secondary | ICD-10-CM

## 2023-09-23 NOTE — Telephone Encounter (Signed)
Pharmacy team, please see what a covered alternative would be. Thank you!

## 2023-09-24 LAB — ALLERGEN PANEL (27) + IGE
Alternaria Alternata IgE: <0.1 kU/L
Aspergillus Fumigatus IgE: <0.1 kU/L
Bahia Grass IgE: 0.4 kU/L — AB
Bermuda Grass IgE: 0.22 kU/L — AB
Cat Dander IgE: 1.25 kU/L — AB
Cedar, Mountain IgE: <0.1 kU/L
Cladosporium Herbarum IgE: <0.1 kU/L
Cocklebur IgE: <0.1 kU/L
Cockroach, American IgE: <0.1 kU/L
Common Silver Birch IgE: <0.1 kU/L
D Farinae IgE: <0.1 kU/L
D Pteronyssinus IgE: <0.1 kU/L
Dog Dander IgE: 0.22 kU/L — AB
Elm, American IgE: 0.11 kU/L — AB
Hickory, White IgE: <0.1 kU/L
IgE (Immunoglobulin E), Serum: 130 [IU]/mL (ref 6–495)
Johnson Grass IgE: 0.25 kU/L — AB
Kentucky Bluegrass IgE: 0.56 kU/L — AB
Maple/Box Elder IgE: <0.1 kU/L
Mucor Racemosus IgE: <0.1 kU/L
Oak, White IgE: <0.1 kU/L
Penicillium Chrysogen IgE: <0.1 kU/L
Pigweed, Rough IgE: <0.1 kU/L
Plantain, English IgE: <0.1 kU/L
Ragweed, Short IgE: 0.23 kU/L — AB
Setomelanomma Rostrat: <0.1 kU/L
Timothy Grass IgE: 0.28 kU/L — AB
White Mulberry IgE: <0.1 kU/L

## 2023-09-26 NOTE — Progress Notes (Signed)
Synopsis: Referred in by Georgianne Fick, MD   Subjective:   PATIENT ID: Linda Christian GENDER: female DOB: Feb 28, 1961, MRN: [id number]  Chief Complaint  Patient presents with   Consult    Started in Nov. Had pneumonia. Took antibiotics, Prednisone, and cough meds. Cough with white/red sputum. Little wheezing. DOE.    HPI Linda Christian is a 63 year old female patient with a past medical history of hypertension and GERD presenting today with ongoing chronic cough.   She has been dealing with this dry cough for over a year. It is described as mostly dry. And was thought to be secondary to post nasal drip which was treated without any response. She reports that cutting grass exacerbates her symptoms.   FENO 30   CXR 2023 No radiographic evidence of parenchymal lung disease.   FH - No family history of pulmonary disease   SH - Never smoker and denies vaping.   ROS All systems were reviewed and are negative except for the above.  Objective:   Vitals:   09/22/23 1454  BP: 132/84  Pulse: (!) 103  Temp: (!) 97.5 F (36.4 C)  SpO2: 97%  Weight: 167 lb (75.8 kg)  Height: 5\' 6"  (1.676 m)   97% on RA BMI Readings from Last 3 Encounters:  09/22/23 26.95 kg/m  05/08/16 28.73 kg/m  04/24/16 28.83 kg/m   Wt Readings from Last 3 Encounters:  09/22/23 167 lb (75.8 kg)  05/08/16 178 lb (80.7 kg)  04/24/16 178 lb 9.6 oz (81 kg)    Physical Exam GEN: NAD, Healthy Appearing HEENT: Supple Neck, Reactive Pupils, EOMI  CVS: Normal S1, Normal S2, RRR, No murmurs or ES appreciated  Lungs: Clear bilateral air entry.  Abdomen: Soft, non tender, non distended, + BS  Extremities: Warm and well perfused, No edema  Skin: No suspicious lesions appreciated  Psych: Normal Affect  Ancillary Information   CBC    Component Value Date/Time   WBC 7.6 09/22/2023 1533   RBC 4.75 09/22/2023 1533   HGB 13.8 09/22/2023 1533   HCT 41.0 09/22/2023 1533   PLT 225 09/22/2023 1533   MCV  86.3 09/22/2023 1533   MCH 29.1 09/22/2023 1533   MCHC 33.7 09/22/2023 1533   RDW 11.9 09/22/2023 1533   LYMPHSABS 3.0 09/22/2023 1533   MONOABS 0.6 09/22/2023 1533   EOSABS 0.2 09/22/2023 1533   BASOSABS 0.1 09/22/2023 1533   Labs and imaging were reviewed.      No data to display          Assessment & Plan:  Linda Christian is a 63 year old female patient with a past medical history of hypertension and GERD presenting today with ongoing chronic cough.   Appears to be secondary to CVA from history of exacerbations when cutting grass, no improvement with management of post nasal drip and GERD.   I will obtain PFTs for further evaluation, allergen panel, cbc w/ diff and total IgE. I will also trial Symbicort 160-4.5 and assess response.   Return in about 3 months (around 12/21/2023).  I spent 60 minutes caring for this patient today, including preparing to see the patient, obtaining a medical history , reviewing a separately obtained history, performing a medically appropriate examination and/or evaluation, ordering medications, tests, or procedures, documenting clinical information in the electronic health record, and independently interpreting results (not separately reported/billed) and communicating results to the patient/family/caregiver  Janann Colonel, MD [facility] 09/26/2023 3:54 PM

## 2023-10-01 ENCOUNTER — Other Ambulatory Visit (HOSPITAL_COMMUNITY): Payer: Self-pay

## 2023-10-01 ENCOUNTER — Telehealth: Payer: Self-pay

## 2023-10-01 MED ORDER — FLUTICASONE-SALMETEROL 250-50 MCG/ACT IN AEPB: 1.0000 | INHALATION_SPRAY | Freq: Two times a day (BID) | RESPIRATORY_TRACT | 6 refills | Status: AC

## 2023-10-01 NOTE — Telephone Encounter (Signed)
Per current test claims the generic Advair Diskus/Wixela is showing at a co-pay of $0.00

## 2023-10-01 NOTE — Telephone Encounter (Signed)
Pharmacy Patient Advocate Encounter  Insurance verification completed.   The patient is insured through [facility] [employer]   Ran test claim for Wixela/Advair Diskus. Currently a quantity of 60 is a 30 day supply and the co-pay is $0.00 . The current 30 day co-pay is, $0.00.  No PA needed at this time.  This test claim was processed through [facility]- copay amounts may vary at other pharmacies due to pharmacy/plan contracts, or as the patient moves through the different stages of their insurance plan.

## 2023-10-01 NOTE — Telephone Encounter (Signed)
 Dr. Larinda Buttery please advise.

## 2023-10-11 ENCOUNTER — Ambulatory Visit (HOSPITAL_COMMUNITY)
Admission: RE | Admit: 2023-10-11 | Discharge: 2023-10-11 | Disposition: A | Discharge status: Home / Self Care | Payer: Self-pay | Source: Ambulatory Visit | Attending: Nurse Practitioner | Admitting: Nurse Practitioner

## 2023-10-11 DIAGNOSIS — E782 Mixed hyperlipidemia: Secondary | ICD-10-CM | POA: Insufficient documentation

## 2023-10-11 MED ORDER — SODIUM CHLORIDE (PF) 0.9 % IJ SOLN
INTRAMUSCULAR | Status: AC
  Filled 2023-10-11: qty 50

## 2023-10-12 DIAGNOSIS — M7542 Impingement syndrome of left shoulder: Secondary | ICD-10-CM | POA: Diagnosis not present

## 2023-10-12 DIAGNOSIS — M5416 Radiculopathy, lumbar region: Secondary | ICD-10-CM | POA: Diagnosis not present

## 2023-10-12 DIAGNOSIS — M5412 Radiculopathy, cervical region: Secondary | ICD-10-CM | POA: Diagnosis not present

## 2023-10-12 DIAGNOSIS — M5030 Other cervical disc degeneration, unspecified cervical region: Secondary | ICD-10-CM | POA: Diagnosis not present

## 2023-10-12 DIAGNOSIS — M7918 Myalgia, other site: Secondary | ICD-10-CM | POA: Diagnosis not present

## 2023-10-25 ENCOUNTER — Other Ambulatory Visit: Payer: Self-pay | Admitting: Nurse Practitioner

## 2023-10-25 DIAGNOSIS — E041 Nontoxic single thyroid nodule: Secondary | ICD-10-CM

## 2023-10-27 ENCOUNTER — Other Ambulatory Visit: Payer: Self-pay

## 2023-10-28 ENCOUNTER — Ambulatory Visit
Admission: RE | Admit: 2023-10-28 | Discharge: 2023-10-28 | Disposition: A | Discharge status: Home / Self Care | Payer: PPO | Source: Ambulatory Visit | Attending: Nurse Practitioner | Admitting: Nurse Practitioner

## 2023-10-28 DIAGNOSIS — E041 Nontoxic single thyroid nodule: Secondary | ICD-10-CM | POA: Diagnosis not present

## 2023-11-02 DIAGNOSIS — R300 Dysuria: Secondary | ICD-10-CM | POA: Diagnosis not present

## 2023-11-15 DIAGNOSIS — N3000 Acute cystitis without hematuria: Secondary | ICD-10-CM | POA: Diagnosis not present

## 2023-11-15 DIAGNOSIS — N3001 Acute cystitis with hematuria: Secondary | ICD-10-CM | POA: Diagnosis not present

## 2023-11-25 DIAGNOSIS — S92811A Other fracture of right foot, initial encounter for closed fracture: Secondary | ICD-10-CM | POA: Diagnosis not present

## 2023-11-25 DIAGNOSIS — S99921A Unspecified injury of right foot, initial encounter: Secondary | ICD-10-CM | POA: Diagnosis not present

## 2023-11-26 ENCOUNTER — Other Ambulatory Visit: Payer: Self-pay | Admitting: Family Medicine

## 2023-11-26 DIAGNOSIS — S92811A Other fracture of right foot, initial encounter for closed fracture: Secondary | ICD-10-CM

## 2023-11-29 DIAGNOSIS — N390 Urinary tract infection, site not specified: Secondary | ICD-10-CM | POA: Diagnosis not present

## 2023-11-29 DIAGNOSIS — E119 Type 2 diabetes mellitus without complications: Secondary | ICD-10-CM | POA: Diagnosis not present

## 2023-11-29 DIAGNOSIS — I10 Essential (primary) hypertension: Secondary | ICD-10-CM | POA: Diagnosis not present

## 2023-11-29 DIAGNOSIS — R109 Unspecified abdominal pain: Secondary | ICD-10-CM | POA: Diagnosis not present

## 2023-12-01 ENCOUNTER — Ambulatory Visit
Admission: RE | Admit: 2023-12-01 | Discharge: 2023-12-01 | Disposition: A | Discharge status: Home / Self Care | Source: Ambulatory Visit | Attending: Family Medicine | Admitting: Family Medicine

## 2023-12-01 DIAGNOSIS — S92811A Other fracture of right foot, initial encounter for closed fracture: Secondary | ICD-10-CM

## 2023-12-01 DIAGNOSIS — M7732 Calcaneal spur, left foot: Secondary | ICD-10-CM | POA: Diagnosis not present

## 2023-12-01 DIAGNOSIS — R600 Localized edema: Secondary | ICD-10-CM | POA: Diagnosis not present

## 2023-12-01 DIAGNOSIS — M79671 Pain in right foot: Secondary | ICD-10-CM | POA: Diagnosis not present

## 2024-01-07 DIAGNOSIS — N3020 Other chronic cystitis without hematuria: Secondary | ICD-10-CM | POA: Diagnosis not present

## 2024-01-13 ENCOUNTER — Ambulatory Visit: Payer: Self-pay | Admitting: Pulmonary Disease

## 2024-02-09 DIAGNOSIS — M7918 Myalgia, other site: Secondary | ICD-10-CM | POA: Diagnosis not present

## 2024-02-09 DIAGNOSIS — M5030 Other cervical disc degeneration, unspecified cervical region: Secondary | ICD-10-CM | POA: Diagnosis not present

## 2024-02-09 DIAGNOSIS — M5412 Radiculopathy, cervical region: Secondary | ICD-10-CM | POA: Diagnosis not present

## 2024-02-14 DIAGNOSIS — E1165 Type 2 diabetes mellitus with hyperglycemia: Secondary | ICD-10-CM | POA: Diagnosis not present

## 2024-02-14 DIAGNOSIS — E041 Nontoxic single thyroid nodule: Secondary | ICD-10-CM | POA: Diagnosis not present

## 2024-02-21 DIAGNOSIS — E785 Hyperlipidemia, unspecified: Secondary | ICD-10-CM | POA: Diagnosis not present

## 2024-02-21 DIAGNOSIS — E1165 Type 2 diabetes mellitus with hyperglycemia: Secondary | ICD-10-CM | POA: Diagnosis not present

## 2024-02-21 DIAGNOSIS — E041 Nontoxic single thyroid nodule: Secondary | ICD-10-CM | POA: Diagnosis not present

## 2024-02-21 DIAGNOSIS — I10 Essential (primary) hypertension: Secondary | ICD-10-CM | POA: Diagnosis not present

## 2024-02-21 DIAGNOSIS — N1831 Chronic kidney disease, stage 3a: Secondary | ICD-10-CM | POA: Diagnosis not present

## 2024-03-08 DIAGNOSIS — Z1231 Encounter for screening mammogram for malignant neoplasm of breast: Secondary | ICD-10-CM | POA: Diagnosis not present

## 2024-03-21 DIAGNOSIS — E782 Mixed hyperlipidemia: Secondary | ICD-10-CM | POA: Diagnosis not present

## 2024-03-21 DIAGNOSIS — E1165 Type 2 diabetes mellitus with hyperglycemia: Secondary | ICD-10-CM | POA: Diagnosis not present

## 2024-03-21 DIAGNOSIS — Z780 Asymptomatic menopausal state: Secondary | ICD-10-CM | POA: Diagnosis not present

## 2024-03-21 DIAGNOSIS — I10 Essential (primary) hypertension: Secondary | ICD-10-CM | POA: Diagnosis not present

## 2024-03-29 DIAGNOSIS — E041 Nontoxic single thyroid nodule: Secondary | ICD-10-CM | POA: Diagnosis not present

## 2024-03-29 DIAGNOSIS — E1165 Type 2 diabetes mellitus with hyperglycemia: Secondary | ICD-10-CM | POA: Diagnosis not present

## 2024-03-29 DIAGNOSIS — M159 Polyosteoarthritis, unspecified: Secondary | ICD-10-CM | POA: Diagnosis not present

## 2024-03-29 DIAGNOSIS — N182 Chronic kidney disease, stage 2 (mild): Secondary | ICD-10-CM | POA: Diagnosis not present

## 2024-03-29 DIAGNOSIS — E782 Mixed hyperlipidemia: Secondary | ICD-10-CM | POA: Diagnosis not present

## 2024-03-29 DIAGNOSIS — I10 Essential (primary) hypertension: Secondary | ICD-10-CM | POA: Diagnosis not present

## 2024-03-29 DIAGNOSIS — G720 Drug-induced myopathy: Secondary | ICD-10-CM | POA: Diagnosis not present

## 2024-04-15 DIAGNOSIS — R0981 Nasal congestion: Secondary | ICD-10-CM | POA: Diagnosis not present

## 2024-04-15 DIAGNOSIS — H9209 Otalgia, unspecified ear: Secondary | ICD-10-CM | POA: Diagnosis not present

## 2024-04-15 DIAGNOSIS — H66001 Acute suppurative otitis media without spontaneous rupture of ear drum, right ear: Secondary | ICD-10-CM | POA: Diagnosis not present

## 2024-04-15 DIAGNOSIS — E119 Type 2 diabetes mellitus without complications: Secondary | ICD-10-CM | POA: Diagnosis not present

## 2024-05-05 DIAGNOSIS — B369 Superficial mycosis, unspecified: Secondary | ICD-10-CM | POA: Diagnosis not present

## 2024-05-05 DIAGNOSIS — H6240 Otitis externa in other diseases classified elsewhere, unspecified ear: Secondary | ICD-10-CM | POA: Diagnosis not present

## 2024-05-09 DIAGNOSIS — L900 Lichen sclerosus et atrophicus: Secondary | ICD-10-CM | POA: Diagnosis not present

## 2024-05-09 DIAGNOSIS — L820 Inflamed seborrheic keratosis: Secondary | ICD-10-CM | POA: Diagnosis not present

## 2024-05-23 DIAGNOSIS — E1165 Type 2 diabetes mellitus with hyperglycemia: Secondary | ICD-10-CM | POA: Diagnosis not present

## 2024-05-23 DIAGNOSIS — N1831 Chronic kidney disease, stage 3a: Secondary | ICD-10-CM | POA: Diagnosis not present

## 2024-05-23 DIAGNOSIS — E785 Hyperlipidemia, unspecified: Secondary | ICD-10-CM | POA: Diagnosis not present

## 2024-05-30 DIAGNOSIS — E1165 Type 2 diabetes mellitus with hyperglycemia: Secondary | ICD-10-CM | POA: Diagnosis not present

## 2024-05-30 DIAGNOSIS — Z23 Encounter for immunization: Secondary | ICD-10-CM | POA: Diagnosis not present

## 2024-05-30 DIAGNOSIS — E782 Mixed hyperlipidemia: Secondary | ICD-10-CM | POA: Diagnosis not present

## 2024-05-30 DIAGNOSIS — I10 Essential (primary) hypertension: Secondary | ICD-10-CM | POA: Diagnosis not present

## 2024-08-20 ENCOUNTER — Emergency Department (HOSPITAL_COMMUNITY)

## 2024-08-20 ENCOUNTER — Emergency Department (HOSPITAL_COMMUNITY)
Admission: EM | Admit: 2024-08-20 | Discharge: 2024-08-20 | Disposition: A | Discharge status: Home / Self Care | Attending: Emergency Medicine | Admitting: Emergency Medicine

## 2024-08-20 ENCOUNTER — Other Ambulatory Visit: Payer: Self-pay

## 2024-08-20 ENCOUNTER — Encounter (HOSPITAL_COMMUNITY): Payer: Self-pay

## 2024-08-20 DIAGNOSIS — M545 Low back pain, unspecified: Secondary | ICD-10-CM | POA: Diagnosis present

## 2024-08-20 DIAGNOSIS — R10A1 Flank pain, right side: Secondary | ICD-10-CM | POA: Insufficient documentation

## 2024-08-20 DIAGNOSIS — M5441 Lumbago with sciatica, right side: Secondary | ICD-10-CM | POA: Insufficient documentation

## 2024-08-20 LAB — BASIC METABOLIC PANEL WITH GFR
Anion gap: 11 (ref 5–15)
BUN: 19 mg/dL (ref 8–23)
CO2: 27 mmol/L (ref 22–32)
Calcium: 10.3 mg/dL (ref 8.9–10.3)
Chloride: 97 mmol/L — ABNORMAL LOW (ref 98–111)
Creatinine, Ser: 1 mg/dL (ref 0.44–1.00)
GFR, Estimated: >60 mL/min
Glucose, Bld: 146 mg/dL — ABNORMAL HIGH (ref 70–99)
Potassium: 4.1 mmol/L (ref 3.5–5.1)
Sodium: 135 mmol/L (ref 135–145)

## 2024-08-20 LAB — URINALYSIS, ROUTINE W REFLEX MICROSCOPIC
Bilirubin Urine: NEGATIVE
Glucose, UA: NEGATIVE mg/dL
Hgb urine dipstick: NEGATIVE
Ketones, ur: NEGATIVE mg/dL
Nitrite: NEGATIVE
Protein, ur: NEGATIVE mg/dL
Specific Gravity, Urine: 1.006 (ref 1.005–1.030)
pH: 6 (ref 5.0–8.0)

## 2024-08-20 LAB — CBC WITH DIFFERENTIAL/PLATELET
Abs Immature Granulocytes: 0.05 K/uL (ref 0.00–0.07)
Basophils Absolute: 0 K/uL (ref 0.0–0.1)
Basophils Relative: 0 %
Eosinophils Absolute: 0 K/uL (ref 0.0–0.5)
Eosinophils Relative: 0 %
HCT: 39.9 % (ref 36.0–46.0)
Hemoglobin: 13.2 g/dL (ref 12.0–15.0)
Immature Granulocytes: 0 %
Lymphocytes Relative: 13 %
Lymphs Abs: 1.6 K/uL (ref 0.7–4.0)
MCH: 28.6 pg (ref 26.0–34.0)
MCHC: 33.1 g/dL (ref 30.0–36.0)
MCV: 86.4 fL (ref 80.0–100.0)
Monocytes Absolute: 0.8 K/uL (ref 0.1–1.0)
Monocytes Relative: 6 %
Neutro Abs: 9.8 K/uL — ABNORMAL HIGH (ref 1.7–7.7)
Neutrophils Relative %: 81 %
Platelets: 242 K/uL (ref 150–400)
RBC: 4.62 MIL/uL (ref 3.87–5.11)
RDW: 12.4 % (ref 11.5–15.5)
WBC: 12.3 K/uL — ABNORMAL HIGH (ref 4.0–10.5)
nRBC: 0 % (ref 0.0–0.2)

## 2024-08-20 MED ORDER — OXYCODONE-ACETAMINOPHEN 5-325 MG PO TABS: 1.0000 | ORAL_TABLET | Freq: Four times a day (QID) | ORAL | 0 refills | Status: AC | PRN

## 2024-08-20 MED ORDER — OXYCODONE-ACETAMINOPHEN 5-325 MG PO TABS
1.0000 | ORAL_TABLET | Freq: Once | ORAL | Status: AC
  Administered 2024-08-20: 1 via ORAL
  Filled 2024-08-20: qty 1

## 2024-08-20 MED ORDER — CEPHALEXIN 500 MG PO CAPS: 500.0000 mg | ORAL_CAPSULE | Freq: Two times a day (BID) | ORAL | 0 refills | Status: AC

## 2024-08-20 MED ORDER — CEPHALEXIN 500 MG PO CAPS
500.0000 mg | ORAL_CAPSULE | Freq: Once | ORAL | Status: AC
  Administered 2024-08-20: 500 mg via ORAL
  Filled 2024-08-20: qty 1

## 2024-08-20 NOTE — ED Triage Notes (Signed)
 Pt c/o R.flank pain radiating to lower back and upper hip since Friday morning level 10/10. Pt states she has malodorous urine no Dysuria.  Dneies N&V,D. Took Tylenol  & prednisone this morning prescribed by urgent care Friday for inflammation.

## 2024-08-20 NOTE — Discharge Instructions (Addendum)
 Continue steroids.  We are starting you on antibiotics for possible infection.  Short course of narcotic pain medication.  Monitor for signs of constipation.  Follow-up with your primary care doctor.  Return if any worsening or concerning symptoms.

## 2024-08-20 NOTE — ED Provider Notes (Signed)
 " [place] EMERGENCY DEPARTMENT AT [facility] Provider Note   CSN: [id number] Arrival date & time: 08/20/24  8191     Patient presents with: Flank Pain and Back Pain   Linda Christian is a 63 y.o. female.  She is here with a complaint of right lower back into the buttock pain that is been going on for 3 days.  Worse with any type of movement.  Does not recall any trauma.  No significant history of back problems.  She does endorse also a little bit of dysuria.  No fevers nausea or vomiting.  No incontinence of urine or bowel.  She went to urgent care where they put her on steroids and got an x-ray.  Pain is mostly with any type of movement.  No numbness or weakness though.  {Add pertinent medical, surgical, social history, OB history to YEP:67052} The history is provided by the patient.  Flank Pain This is a new problem. The current episode started more than 2 days ago. The problem occurs constantly. The problem has not changed since onset.Pertinent negatives include no chest pain, no abdominal pain, no headaches and no shortness of breath. The symptoms are aggravated by bending and twisting. Nothing relieves the symptoms. She has tried rest for the symptoms. The treatment provided no relief.  Back Pain Associated symptoms: no abdominal pain, no chest pain and no headaches        Prior to Admission medications  Medication Sig Start Date End Date Taking? Authorizing Provider  fluticasone -salmeterol (ADVAIR) 250-50 MCG/ACT AEPB Inhale 1 puff into the lungs in the morning and at bedtime. 10/01/23   Assaker, Darrin, MD  Multiple Vitamin (MULTIVITAMIN) tablet Take 1 tablet by mouth daily.    [provider]  omeprazole (PRILOSEC) 40 MG capsule Take 40 mg by mouth daily. 07/14/22   [provider]  triamterene-hydrochlorothiazide  (DYAZIDE) 37.5-25 MG capsule Take 1 capsule by mouth daily. 11/23/17   [provider]  TRULICITY 0.75 MG/0.5ML SOAJ Inject  0.75 mg into the skin once a week.    [provider]    Allergies: Shellfish allergy    Review of Systems  Respiratory:  Negative for shortness of breath.   Cardiovascular:  Negative for chest pain.  Gastrointestinal:  Negative for abdominal pain.  Genitourinary:  Positive for flank pain.  Musculoskeletal:  Positive for back pain.  Neurological:  Negative for headaches.    Updated Vital Signs BP (!) 108/96 (BP Location: Left Arm)   Pulse 96   Temp 98.1 F (36.7 C) (Oral)   Resp 16   Ht 5' 6 (1.676 m)   Wt 75.8 kg   SpO2 98%   BMI 26.97 kg/m   Physical Exam Vitals and nursing note reviewed.  Constitutional:      General: She is not in acute distress.    Appearance: Normal appearance. She is well-developed.  HENT:     Head: Normocephalic and atraumatic.  Eyes:     Conjunctiva/sclera: Conjunctivae normal.  Cardiovascular:     Rate and Rhythm: Normal rate and regular rhythm.     Heart sounds: No murmur heard. Pulmonary:     Effort: Pulmonary effort is normal. No respiratory distress.     Breath sounds: Normal breath sounds. No stridor. No wheezing.  Abdominal:     Palpations: Abdomen is soft.     Tenderness: There is no abdominal tenderness. There is no guarding or rebound.  Musculoskeletal:        General:  Tenderness present. No deformity.     Cervical back: Neck supple.     Comments: She has some tenderness in the right paralumbar into right buttock area.  No CVA tenderness.  Skin:    General: Skin is warm and dry.  Neurological:     General: No focal deficit present.     Mental Status: She is alert.     GCS: GCS eye subscore is 4. GCS verbal subscore is 5. GCS motor subscore is 6.     Sensory: No sensory deficit.     Motor: No weakness.     (all labs ordered are listed, but only abnormal results are displayed) Labs Reviewed  URINALYSIS, ROUTINE W REFLEX MICROSCOPIC - Abnormal; Notable for the following components:      Result Value   Color,  Urine STRAW (*)    Leukocytes,Ua TRACE (*)    Bacteria, UA MANY (*)    All other components within normal limits  URINE CULTURE  BASIC METABOLIC PANEL WITH GFR  CBC WITH DIFFERENTIAL/PLATELET    EKG: None  Radiology: No results found.  {Document cardiac monitor, telemetry assessment procedure when appropriate:32947} Procedures   Medications Ordered in the ED  oxyCODONE -acetaminophen  (PERCOCET/ROXICET) 5-325 MG per tablet 1 tablet (has no administration in time range)      {Click here for ABCD2, HEART and other calculators REFRESH Note before signing:1}                              Medical Decision Making Amount and/or Complexity of Data Reviewed Labs: ordered. Radiology: ordered.  Risk Prescription drug management.   This patient complains of ***; this involves an extensive number of treatment Options and is a complaint that carries with it a high risk of complications and morbidity. The differential includes ***  I ordered, reviewed and interpreted labs, which included *** I ordered medication *** and reviewed PMP when indicated. I ordered imaging studies which included *** and I independently    visualized and interpreted imaging which showed *** Additional history obtained from *** Previous records obtained and reviewed *** I consulted *** and discussed lab and imaging findings and discussed disposition.  Cardiac monitoring reviewed, *** Social determinants considered, *** Critical Interventions: ***  After the interventions stated above, I reevaluated the patient and found *** Admission and further testing considered, ***   {Document critical care time when appropriate  Document review of labs and clinical decision tools ie CHADS2VASC2, etc  Document your independent review of radiology images and any outside records  Document your discussion with family members, caretakers and with consultants  Document social determinants of health affecting pt's care   Document your decision making why or why not admission, treatments were needed:32947:::1}   Final diagnoses:  None    ED Discharge Orders     None        "

## 2024-08-21 LAB — URINE CULTURE: Culture: NO GROWTH
